# Patient Record
Sex: Female | Born: 1965 | Race: White | Hispanic: No | Marital: Married | Smoking: Current every day smoker
Health system: Southern US, Community
[De-identification: ages and names within clinical notes are randomized; demographics above are authoritative.]

## PROBLEM LIST (undated history)

## (undated) DIAGNOSIS — I10 Essential (primary) hypertension: Secondary | ICD-10-CM

## (undated) DIAGNOSIS — I4891 Unspecified atrial fibrillation: Secondary | ICD-10-CM

## (undated) DIAGNOSIS — F419 Anxiety disorder, unspecified: Secondary | ICD-10-CM

## (undated) HISTORY — PX: TUBAL LIGATION: SHX77

---

## 2005-12-09 ENCOUNTER — Emergency Department: Payer: Self-pay | Admitting: Emergency Medicine

## 2006-04-11 ENCOUNTER — Other Ambulatory Visit: Payer: Self-pay

## 2006-04-11 ENCOUNTER — Emergency Department: Payer: Self-pay | Admitting: Unknown Physician Specialty

## 2007-04-03 ENCOUNTER — Emergency Department: Payer: Self-pay | Admitting: Emergency Medicine

## 2012-05-10 DIAGNOSIS — Z720 Tobacco use: Secondary | ICD-10-CM | POA: Insufficient documentation

## 2012-05-11 DIAGNOSIS — J189 Pneumonia, unspecified organism: Secondary | ICD-10-CM

## 2012-05-11 HISTORY — DX: Pneumonia, unspecified organism: J18.9

## 2012-05-13 DIAGNOSIS — R918 Other nonspecific abnormal finding of lung field: Secondary | ICD-10-CM | POA: Insufficient documentation

## 2012-05-13 DIAGNOSIS — R0602 Shortness of breath: Secondary | ICD-10-CM | POA: Insufficient documentation

## 2012-05-13 HISTORY — DX: Shortness of breath: R06.02

## 2014-08-22 ENCOUNTER — Inpatient Hospital Stay
Admit: 2014-08-22 | Discharge: 2014-08-22 | Disposition: A | Discharge status: Home / Self Care | Payer: Self-pay | Attending: Internal Medicine | Admitting: Internal Medicine

## 2014-08-22 ENCOUNTER — Emergency Department: Payer: Self-pay

## 2014-08-22 ENCOUNTER — Inpatient Hospital Stay
Admission: EM | Admit: 2014-08-22 | Discharge: 2014-08-24 | DRG: 310 | Disposition: A | Discharge status: Home / Self Care | Payer: Self-pay | Attending: Internal Medicine | Admitting: Internal Medicine

## 2014-08-22 DIAGNOSIS — Z8249 Family history of ischemic heart disease and other diseases of the circulatory system: Secondary | ICD-10-CM

## 2014-08-22 DIAGNOSIS — F17210 Nicotine dependence, cigarettes, uncomplicated: Secondary | ICD-10-CM | POA: Diagnosis present

## 2014-08-22 DIAGNOSIS — Z7982 Long term (current) use of aspirin: Secondary | ICD-10-CM

## 2014-08-22 DIAGNOSIS — Z9851 Tubal ligation status: Secondary | ICD-10-CM

## 2014-08-22 DIAGNOSIS — Z833 Family history of diabetes mellitus: Secondary | ICD-10-CM

## 2014-08-22 DIAGNOSIS — E876 Hypokalemia: Secondary | ICD-10-CM | POA: Diagnosis present

## 2014-08-22 DIAGNOSIS — F1020 Alcohol dependence, uncomplicated: Secondary | ICD-10-CM | POA: Diagnosis present

## 2014-08-22 DIAGNOSIS — I4891 Unspecified atrial fibrillation: Principal | ICD-10-CM

## 2014-08-22 DIAGNOSIS — Z79899 Other long term (current) drug therapy: Secondary | ICD-10-CM

## 2014-08-22 DIAGNOSIS — F419 Anxiety disorder, unspecified: Secondary | ICD-10-CM | POA: Diagnosis present

## 2014-08-22 HISTORY — DX: Unspecified atrial fibrillation: I48.91

## 2014-08-22 HISTORY — DX: Anxiety disorder, unspecified: F41.9

## 2014-08-22 LAB — BASIC METABOLIC PANEL
Anion gap: 13 (ref 5–15)
BUN: 5 mg/dL — ABNORMAL LOW (ref 6–20)
CO2: 21 mmol/L — ABNORMAL LOW (ref 22–32)
Calcium: 9 mg/dL (ref 8.9–10.3)
Chloride: 106 mmol/L (ref 101–111)
Creatinine, Ser: 0.49 mg/dL (ref 0.44–1.00)
GFR calc Af Amer: >60 mL/min (ref >60)
GFR calc non Af Amer: >60 mL/min (ref >60)
Glucose, Bld: 107 mg/dL — ABNORMAL HIGH (ref 65–99)
Potassium: 3.5 mmol/L (ref 3.5–5.1)
Sodium: 140 mmol/L (ref 135–145)

## 2014-08-22 LAB — CBC WITH DIFFERENTIAL/PLATELET
Basophils Absolute: 0 10*3/uL (ref 0–0.1)
Basophils Relative: 1 %
Eosinophils Absolute: 0 10*3/uL (ref 0–0.7)
Eosinophils Relative: 1 %
HCT: 45.7 % (ref 35.0–47.0)
Hemoglobin: 15.8 g/dL (ref 12.0–16.0)
Lymphocytes Relative: 20 %
Lymphs Abs: 1.4 10*3/uL (ref 1.0–3.6)
MCH: 34.9 pg — ABNORMAL HIGH (ref 26.0–34.0)
MCHC: 34.6 g/dL (ref 32.0–36.0)
MCV: 100.8 fL — ABNORMAL HIGH (ref 80.0–100.0)
Monocytes Absolute: 0.6 10*3/uL (ref 0.2–0.9)
Monocytes Relative: 9 %
Neutro Abs: 5 10*3/uL (ref 1.4–6.5)
Neutrophils Relative %: 69 %
Platelets: 216 10*3/uL (ref 150–440)
RBC: 4.53 MIL/uL (ref 3.80–5.20)
RDW: 12.5 % (ref 11.5–14.5)
WBC: 7.2 10*3/uL (ref 3.6–11.0)

## 2014-08-22 LAB — URINE DRUG SCREEN, QUALITATIVE (ARMC ONLY)
Amphetamines, Ur Screen: NOT DETECTED
Barbiturates, Ur Screen: NOT DETECTED
Benzodiazepine, Ur Scrn: POSITIVE — AB
Cannabinoid 50 Ng, Ur ~~LOC~~: NOT DETECTED
Cocaine Metabolite,Ur ~~LOC~~: NOT DETECTED
MDMA (Ecstasy)Ur Screen: NOT DETECTED
Methadone Scn, Ur: NOT DETECTED
Opiate, Ur Screen: NOT DETECTED
Phencyclidine (PCP) Ur S: NOT DETECTED
Tricyclic, Ur Screen: NOT DETECTED

## 2014-08-22 LAB — MRSA PCR SCREENING: MRSA by PCR: NEGATIVE

## 2014-08-22 LAB — TSH: TSH: 1.76 u[IU]/mL (ref 0.350–4.500)

## 2014-08-22 LAB — TROPONIN I
Troponin I: <0.03 ng/mL (ref <0.031)
Troponin I: <0.03 ng/mL (ref <0.031)
Troponin I: <0.03 ng/mL (ref <0.031)

## 2014-08-22 LAB — PHOSPHORUS: Phosphorus: 3.4 mg/dL (ref 2.5–4.6)

## 2014-08-22 LAB — MAGNESIUM: Magnesium: 1.5 mg/dL — ABNORMAL LOW (ref 1.7–2.4)

## 2014-08-22 MED ORDER — ENOXAPARIN SODIUM 60 MG/0.6ML ~~LOC~~ SOLN
50.0000 mg | Freq: Two times a day (BID) | SUBCUTANEOUS | Status: DC
  Administered 2014-08-22 – 2014-08-24 (×4): 50 mg via SUBCUTANEOUS
  Filled 2014-08-22 (×4): qty 0.6

## 2014-08-22 MED ORDER — AMIODARONE LOAD VIA INFUSION
150.0000 mg | Freq: Once | INTRAVENOUS | Status: AC
  Administered 2014-08-22: 150 mg via INTRAVENOUS
  Filled 2014-08-22: qty 83.34

## 2014-08-22 MED ORDER — DILTIAZEM HCL 25 MG/5ML IV SOLN
20.0000 mg | Freq: Once | INTRAVENOUS | Status: AC
  Administered 2014-08-22: 20 mg via INTRAVENOUS

## 2014-08-22 MED ORDER — DEXTROSE 5 % IV SOLN
5.0000 mg/h | Freq: Once | INTRAVENOUS | Status: AC
  Administered 2014-08-22: 5 mg/h via INTRAVENOUS
  Filled 2014-08-22: qty 100

## 2014-08-22 MED ORDER — SODIUM CHLORIDE 0.9 % IV BOLUS (SEPSIS)
1000.0000 mL | Freq: Once | INTRAVENOUS | Status: AC
  Administered 2014-08-22: 1000 mL via INTRAVENOUS

## 2014-08-22 MED ORDER — AMIODARONE HCL IN DEXTROSE 360-4.14 MG/200ML-% IV SOLN
30.0000 mg/h | INTRAVENOUS | Status: DC
  Administered 2014-08-23 – 2014-08-24 (×4): 30 mg/h via INTRAVENOUS
  Filled 2014-08-22 (×8): qty 200

## 2014-08-22 MED ORDER — AMIODARONE HCL IN DEXTROSE 360-4.14 MG/200ML-% IV SOLN
60.0000 mg/h | INTRAVENOUS | Status: DC
  Administered 2014-08-22 (×2): 60 mg/h via INTRAVENOUS
  Filled 2014-08-22 (×2): qty 200

## 2014-08-22 MED ORDER — DEXTROSE 5 % IV SOLN
5.0000 mg/h | INTRAVENOUS | Status: DC
  Administered 2014-08-22: 7.5 mg/h via INTRAVENOUS

## 2014-08-22 MED ORDER — DIAZEPAM 5 MG PO TABS
5.0000 mg | ORAL_TABLET | Freq: Three times a day (TID) | ORAL | Status: DC | PRN
  Administered 2014-08-22 – 2014-08-24 (×4): 5 mg via ORAL
  Filled 2014-08-22 (×4): qty 1

## 2014-08-22 MED ORDER — NICOTINE 21 MG/24HR TD PT24
21.0000 mg | MEDICATED_PATCH | Freq: Every day | TRANSDERMAL | Status: DC
  Administered 2014-08-22 – 2014-08-24 (×3): 21 mg via TRANSDERMAL
  Filled 2014-08-22 (×3): qty 1

## 2014-08-22 NOTE — ED Provider Notes (Signed)
[facility] Emergency Department Provider Note   ____________________________________________  Time seen: On EMS arrival I have reviewed the triage vital signs and the triage nursing note.  HISTORY  Chief Complaint Shortness of Breath and Palpitations   Historian Patient  HPI Ariel Hensley is a 49 y.o. female was no history of cardiac or pulmonary disease who was driving this afternoon when she started feel short of breath and feeling her heart was racing and fluttering. She was driving at a time and drove home and tried to lie down when it seemed like it got even worse. When EMS arrived her heart rate was near 200 and appeared to be irregular. They did give adenosine 3 at 6, 12, and 12 mg with some slowing but showed a irregular heartbeat which returned to tachycardia between 150 and 180. Patient reporting some symptomatic improvement wearing a nonrebreather mask although she is not hypoxic. No chest pain. No leg swelling. On further discussion she does think that she has been a little bit short of breath for the past 2 weeks, however she thought was due to the humidity outside.    Past Medical History  Diagnosis Date  . Anxiety     There are no active problems to display for this patient.   Past Surgical History  Procedure Laterality Date  . Tubal ligation      Current Outpatient Rx  Name  Route  Sig  Dispense  Refill  . Aspirin-Salicylamide-Caffeine (BC HEADACHE PO)   Oral   Take 1 packet by mouth daily as needed (for headaches).         . diazepam (VALIUM) 10 MG tablet   Oral   Take 5 mg by mouth every 6 (six) hours as needed for anxiety.           Allergies Review of patient's allergies indicates no known allergies.  History reviewed. No pertinent family history.  Social History History  Substance Use Topics  . Smoking status: Current Every Day Smoker  . Smokeless tobacco: Never Used  . Alcohol Use: 1.8 - 3.0 oz/week    3-5  Cans of beer per week   positive smoker  Review of Systems  Constitutional: Negative for fever. Eyes: Negative for visual changes. ENT: Negative for sore throat. Cardiovascular: Negative for chest pain. Positive for palpitations Respiratory: Negative for cough Gastrointestinal: Negative for abdominal pain, vomiting and diarrhea. Genitourinary: Negative for dysuria. Musculoskeletal: Negative for back pain. Skin: Negative for rash. Neurological: Negative for headaches, focal weakness or numbness. 10 point Review of Systems otherwise negative ____________________________________________   PHYSICAL EXAM:  VITAL SIGNS: ED Triage Vitals  Enc Vitals Group     BP 08/22/14 1521 124/93 mmHg     Pulse Rate 08/22/14 1521 89     Resp 08/22/14 1521 17     Temp --      Temp src --      SpO2 08/22/14 1521 99 %     Weight --      Height --      Head Cir --      Peak Flow --      Pain Score --      Pain Loc --      Pain Edu? --      Excl. in GC? --      Constitutional: Alert and oriented. Somewhat anxious.  Eyes: Conjunctivae are normal. PERRL. Normal extraocular movements. ENT   Head: Normocephalic and atraumatic.   Nose: No congestion/rhinnorhea.  Mouth/Throat: Mucous membranes are moist.   Neck: No stridor. Cardiovascular/Chest: Irregularly irregular and tachycardic.  No murmurs, rubs, or gallops. Respiratory: Normal respiratory effort without tachypnea nor retractions. Breath sounds are clear and equal bilaterally. No wheezes/rales/rhonchi. Gastrointestinal: Soft. No distention, no guarding, no rebound. Nontender   Genitourinary/rectal:Deferred Musculoskeletal: Nontender with normal range of motion in all extremities. No joint effusions.  No lower extremity tenderness nor edema. Neurologic:  Normal speech and language. No gross or focal neurologic deficits are appreciated. Skin:  Skin is warm, dry and intact. No rash noted. Psychiatric: Mood and affect are  normal. Speech and behavior are normal. Patient exhibits appropriate insight and judgment.  ____________________________________________   EKG I, Governor Rooks, MD, the attending physician have personally viewed and interpreted all ECGs.  Irregularly irregular. 104 bpm. Atrial fibrillation with rapid ventricular response. Normal axis. Minimal ST segment depression inferiorly. ____________________________________________  LABS (pertinent positives/negatives)  Basic metabolic panel without significant abnormality Troponin less than 0.03 White blood count 7.2, hemoglobin 15.8  ____________________________________________  RADIOLOGY All Xrays were viewed by me. Imaging interpreted by Radiologist.  Chest portable: Negative __________________________________________  PROCEDURES  Procedure(s) performed: None Critical Care performed: CRITICAL CARE Performed by: Governor Rooks   Total critical care time: 30 minutes  Critical care time was exclusive of separately billable procedures and treating other patients.  Critical care was necessary to treat or prevent imminent or life-threatening deterioration.  Critical care was time spent personally by me on the following activities: development of treatment plan with patient and/or surrogate as well as nursing, discussions with consultants, evaluation of patient's response to treatment, examination of patient, obtaining history from patient or surrogate, ordering and performing treatments and interventions, ordering and review of laboratory studies, ordering and review of radiographic studies, pulse oximetry and re-evaluation of patient's condition.   ____________________________________________   ED COURSE / ASSESSMENT AND PLAN  CONSULTATIONS: Hospitalist for admission  Pertinent labs & imaging results that were available during my care of the patient were reviewed by me and considered in my medical decision making (see chart for  details).  Patient arrived by EMS heart rate still in the 150s to 180s and patient feeling very anxious and short of breath. No hypoxia. No chest pain. EKG shows new onset atrial fibrillation with rapid ventricular response. Patient was given a bolus of 20 mg Cardizem and resultant heart rate down to 110 to 130. Diltiazem drip was started.  Laboratory evaluation is reassuring. I discussed the case with the hospitalist for admission.   Patient / Family / Caregiver informed of clinical course, medical decision-making process, and agree with plan.   I discussed return precautions, follow-up instructions, and discharged instructions with patient and/or family.  ___________________________________________   FINAL CLINICAL IMPRESSION(S) / ED DIAGNOSES   Final diagnoses:  Atrial fibrillation with rapid ventricular response  New onset atrial fibrillation     Governor Rooks, MD 08/22/14 [phone]

## 2014-08-22 NOTE — ED Notes (Signed)
Diltiazem (CARDIZEM) titrated to 7.5mg /hr from /hr.

## 2014-08-22 NOTE — ED Notes (Signed)
According to EMS, pt began to feel like her heart was racing and fluttering while driving. After an hr of experiencing this she began to become SOB. Pt is A+OX4, stating she feels weak, denies pain. Pt currently in SVT HR 157, 59 P, 96% RA, 124/93 BP

## 2014-08-22 NOTE — Progress Notes (Signed)
ANTICOAGULATION CONSULT NOTE - Initial Consult  Pharmacy Consult for Lovenox Indication: atrial fibrillation  No Known Allergies  Patient Measurements: Weight: 113 lb 5.1 oz (51.4 kg) Heparin Dosing Weight:   Vital Signs: BP: 114/81 mmHg (08/05 1723) Pulse Rate: 117 (08/05 1723)  Labs:  Recent Labs  08/22/14 1534 08/22/14 1718  HGB 15.8  --   HCT 45.7  --   PLT 216  --   CREATININE 0.49  --   TROPONINI <0.03 <0.03    CrCl cannot be calculated (Unknown ideal weight.).   Medical History: Past Medical History  Diagnosis Date  . Anxiety     Medications:  Prescriptions prior to admission  Medication Sig Dispense Refill Last Dose  . Aspirin-Salicylamide-Caffeine (BC HEADACHE PO) Take 1 packet by mouth daily as needed (for headaches).   PRN at PRN  . diazepam (VALIUM) 10 MG tablet Take 5 mg by mouth every 6 (six) hours as needed for anxiety.   08/22/2014 at Unknown time    Assessment: SrCr = 0.49  TBW = 51.4  MD wants /kg BID dosing for Afib.  Goal of Therapy:  VTE prophylaxis Monitor platelets by anticoagulation protocol: Yes   Plan:  Lovenox 50 mg SQ Q12H ordered.  Will monitor CBC daily.   Georg Ang D 08/22/2014,7:25 PM

## 2014-08-22 NOTE — H&P (Addendum)
[facility] - [place] [facility]   PATIENT NAME: Ariel Hensley    MR#:  [id number]  DATE OF BIRTH:  June 19, 1965  DATE OF ADMISSION:  08/22/2014  PRIMARY CARE PHYSICIAN: [facility]  REQUESTING/REFERRING PHYSICIAN: Governor Rooks  CHIEF COMPLAINT:   Chief Complaint  Patient presents with  . Shortness of Breath  . Palpitations    HISTORY OF PRESENT ILLNESS: Ariel Hensley  is a 49 y.o. female with a known history of anxiety but no other medical issues and she follows regularly with [facility] for a physical checkup not on any prescription medications. Today suddenly started having palpitation episodes which was not stopping she tried to relax for a while but did not help it. This episode happened while she was driving just a short distance and no any exertional activities, she denies drinking excessive coffee or caffeinated drinks or energy drinks, excessive smoking, excessive exercise in the last 24 hours. She had similar episodes in the past but it just lasted for like a few seconds and she is fine after that. But today's episode was not stopping she tried to relax and drink some water continued to have the palpitation with chest tightness and feeling of shortness of breath so she decided to call ambulance. EMS gave her the adenosine injections 3. Her heart rate was up to 200 with EMS. In ER her heart rate was still 150s and so she received injection Cardizem twice with short-term response to that but again going back up to 120 and 1:30 heart rate and she continued to be in atrial fibrillation. Her blood pressure is stable and she does not have any chest pain. She was started on Cardizem drip by ER physician and given for admission to hospitalist team.  PAST MEDICAL HISTORY:   Past Medical History  Diagnosis Date  . Anxiety     PAST SURGICAL HISTORY:  Past Surgical History  Procedure Laterality Date  . Tubal ligation      SOCIAL HISTORY:  History   Substance Use Topics  . Smoking status: Current Every Day Smoker -- 1.50 packs/day  . Smokeless tobacco: Never Used  . Alcohol Use: 3.0 - 4.2 oz/week    5-7 Cans of beer per week    FAMILY HISTORY:  Family History  Problem Relation Age of Onset  . Diabetes Mother   . Diabetes Maternal Grandmother   . Heart attack Paternal Grandfather     DRUG ALLERGIES: No Known Allergies  REVIEW OF SYSTEMS:   CONSTITUTIONAL: No fever, fatigue or weakness.  EYES: No blurred or double vision.  EARS, NOSE, AND THROAT: No tinnitus or ear pain.  RESPIRATORY: No cough, had spme SOB with episode.No wheezing or hemoptysis.  CARDIOVASCULAR: No chest pain, orthopnea, edema. Had severe palpitations. GASTROINTESTINAL: No nausea, vomiting, diarrhea or abdominal pain.  GENITOURINARY: No dysuria, hematuria.  ENDOCRINE: No polyuria, nocturia,  HEMATOLOGY: No anemia, easy bruising or bleeding SKIN: No rash or lesion. MUSCULOSKELETAL: No joint pain or arthritis.   NEUROLOGIC: No tingling, numbness, weakness.  PSYCHIATRY: No anxiety or depression.   MEDICATIONS AT HOME:  Prior to Admission medications   Medication Sig Start Date End Date Taking? Authorizing Provider  Aspirin-Salicylamide-Caffeine (BC HEADACHE PO) Take 1 packet by mouth daily as needed (for headaches).   Yes Historical Provider, MD  diazepam (VALIUM) 10 MG tablet Take 5 mg by mouth every 6 (six) hours as needed for anxiety.   Yes Historical Provider, MD      PHYSICAL EXAMINATION:  VITAL SIGNS: Blood pressure 138/113, pulse 96, resp. rate 24, SpO2 100 %.  GENERAL:  49 y.o.-year-old thin patient lying in the bed with no acute distress.  EYES: Pupils equal, round, reactive to light and accommodation. No scleral icterus. Extraocular muscles intact.  HEENT: Head atraumatic, normocephalic. Oropharynx and nasopharynx clear.  NECK:  Supple, no jugular venous distention. No thyroid enlargement, no tenderness.  LUNGS: Normal breath sounds  bilaterally, no wheezing, rales,rhonchi or crepitation. No use of accessory muscles of respiration.  CARDIOVASCULAR: S1, S2 normal, fast and irregular. No murmurs.  ABDOMEN: Soft, nontender, nondistended. Bowel sounds present. No organomegaly or mass.  EXTREMITIES: No pedal edema, cyanosis, or clubbing.  NEUROLOGIC: Cranial nerves II through XII are intact. Muscle strength 5/5 in all extremities. Sensation intact. Gait not checked.  PSYCHIATRIC: The patient is alert and oriented x 3.  SKIN: No obvious rash, lesion, or ulcer.   LABORATORY PANEL:   CBC  Recent Labs Lab 08/22/14 1534  WBC 7.2  HGB 15.8  HCT 45.7  PLT 216  MCV 100.8*  MCH 34.9*  MCHC 34.6  RDW 12.5  LYMPHSABS 1.4  MONOABS 0.6  EOSABS 0.0  BASOSABS 0.0   ------------------------------------------------------------------------------------------------------------------  Chemistries   Recent Labs Lab 08/22/14 1534  NA 140  K 3.5  CL 106  CO2 21*  GLUCOSE 107*  BUN 5*  CREATININE 0.49  CALCIUM 9.0   ------------------------------------------------------------------------------------------------------------------ CrCl cannot be calculated (Unknown ideal weight.). ------------------------------------------------------------------------------------------------------------------ No results for input(s): TSH, T4TOTAL, T3FREE, THYROIDAB in the last 72 hours.  Invalid input(s): FREET3   Coagulation profile No results for input(s): INR, PROTIME in the last 168 hours. ------------------------------------------------------------------------------------------------------------------- No results for input(s): DDIMER in the last 72 hours. -------------------------------------------------------------------------------------------------------------------  Cardiac Enzymes  Recent Labs Lab 08/22/14 1534  TROPONINI <0.03    ------------------------------------------------------------------------------------------------------------------ Invalid input(s): POCBNP  ---------------------------------------------------------------------------------------------------------------  Urinalysis No results found for: COLORURINE, APPEARANCEUR, LABSPEC, PHURINE, GLUCOSEU, HGBUR, BILIRUBINUR, KETONESUR, PROTEINUR, UROBILINOGEN, NITRITE, LEUKOCYTESUR   RADIOLOGY: Dg Chest Port 1 View  08/22/2014   CLINICAL DATA:  New onset atrial fibrillation with shortness of breath  EXAM: PORTABLE CHEST - 1 VIEW  COMPARISON:  April 11, 2006  FINDINGS: There is no edema or consolidation. The heart size and pulmonary vascularity normal. No adenopathy. No bone lesions. There is mild apical pleural thickening on the right, stable.  IMPRESSION: No edema or consolidation.  No change in cardiac silhouette.   Electronically Signed   By: Bretta Bang III M.D.   On: 08/22/2014 15:53    EKG: A fib with ventricular rate up to 120.  IMPRESSION AND PLAN:  * A. fib with RVR I will check TSH level, monitor on telemetry and follow serial troponin. Check echocardiogram. She is on Cardizem IV drip will monitor on stepdown unit, call cardiology consult.  Give lovenox therapeutic dose for now.  * Anxiety continue home medication.  * Smoking Counseled to quit smoking for 4 minutes she would like to nicotine patch here.  * Chronic alcoholism  She drinks 5-7 beers every evening, last drink was last night.  But she says she never going withdrawal if she don't drink for a day or 2.  Currently there are no signs of withdrawal or anxiety but will continue watching for that.  All the records are reviewed and case discussed with ED provider. Management plans discussed with the patient, family and they are in agreement.  CODE STATUS: full   TOTAL TIME TAKING CARE OF THIS PATIENT: 50 minutes critical care.  Plan spent  with patient and her family  who is present in the room about the requirement of continuous heart monitoring due to irregular and fast heartbeat she will be monitored in CCU and they understand and agree with the plan.  Altamese Dilling M.D on 08/22/2014   Between 7am to 6pm - Pager - [phone]  After 6pm go to [email or web address] - password [email or web address] [facility]  [place]  [facility]ospitalists  Office  [phone]  CC: Primary care physician; No primary care provider on file.

## 2014-08-22 NOTE — Progress Notes (Signed)
Ariel Hensley is a 49 y.o. female  [id number]  Primary Cardiologist: Adrian Blackwater Reason for Consultation: Atrial fibrillation  HPI: This is a 49 year old white female with a past medical history of anxiety disorder presented to the emergency room with shortness of breath palpitation and tightness in the chest. Patient states all of a sudden she felt like she is going to pass out and she was having fluttering in the chest. In the emergency room her heart rate was around 200 with rapid ventricular response rate and A. fib. Patient was started on IV Cardizem and her chest pain got better. Patient still has intermittent heaviness in the chest.   Review of Systems: Review of Systems  Respiratory: Positive for shortness of breath.   Cardiovascular: Positive for chest pain and palpitations.  All other systems reviewed and are negative.     Past Medical History  Diagnosis Date  . Anxiety     Medications Prior to Admission  Medication Sig Dispense Refill  . Aspirin-Salicylamide-Caffeine (BC HEADACHE PO) Take 1 packet by mouth daily as needed (for headaches).    . diazepam (VALIUM) 10 MG tablet Take 5 mg by mouth every 6 (six) hours as needed for anxiety.       Marland Kitchen amiodarone  150 mg Intravenous Once  . nicotine  21 mg Transdermal Daily    Infusions: . amiodarone     Followed by  . [START ON 08/23/2014] amiodarone      No Known Allergies  History   Social History  . Marital Status: Married    Spouse Name: N/A  . Number of Children: N/A  . Years of Education: N/A   Occupational History  . Not on file.   Social History Main Topics  . Smoking status: Current Every Day Smoker -- 1.50 packs/day  . Smokeless tobacco: Never Used  . Alcohol Use: 3.0 - 4.2 oz/week    5-7 Cans of beer per week  . Drug Use: No  . Sexual Activity: Not on file   Other Topics Concern  . Not on file   Social History Narrative  . No narrative on file    Family History  Problem Relation  Age of Onset  . Diabetes Mother   . Diabetes Maternal Grandmother   . Heart attack Paternal Grandfather     PHYSICAL EXAM: Filed Vitals:   08/22/14 1723  BP: 114/81  Pulse: 117  Resp: 19    No intake or output data in the 24 hours ending 08/22/14 1816  General:  Well appearing. No respiratory difficulty HEENT: normal Neck: supple. no JVD. Carotids 2+ bilat; no bruits. No lymphadenopathy or thryomegaly appreciated. Cor: PMI nondisplaced. Regular rate & rhythm. No rubs, gallops or murmurs. Lungs: clear Abdomen: soft, nontender, nondistended. No hepatosplenomegaly. No bruits or masses. Good bowel sounds. Extremities: no cyanosis, clubbing, rash, edema Neuro: alert & oriented x 3, cranial nerves grossly intact. moves all 4 extremities w/o difficulty. Affect pleasant.  ECG: EKG reveals A. fib with rapid ventricular response rate. Ventricular rate is about 112 with nonspecific ST-T changes.  Results for orders placed or performed during the hospital encounter of 08/22/14 (from the past 24 hour(s))  Basic metabolic panel     Status: Abnormal   Collection Time: 08/22/14  3:34 PM  Result Value Ref Range   Sodium 140 135 - 145 mmol/L   Potassium 3.5 3.5 - 5.1 mmol/L   Chloride 106 101 - 111 mmol/L   CO2 21 (L) 22 -  32 mmol/L   Glucose, Bld 107 (H) 65 - 99 mg/dL   BUN 5 (L) 6 - 20 mg/dL   Creatinine, Ser 4.09 0.44 - 1.00 mg/dL   Calcium 9.0 8.9 - 81.1 mg/dL   GFR calc non Af Amer >60 >60 mL/min   GFR calc Af Amer >60 >60 mL/min   Anion gap 13 5 - 15  Troponin I     Status: None   Collection Time: 08/22/14  3:34 PM  Result Value Ref Range   Troponin I <0.03 <0.031 ng/mL  CBC with Differential     Status: Abnormal   Collection Time: 08/22/14  3:34 PM  Result Value Ref Range   WBC 7.2 3.6 - 11.0 K/uL   RBC 4.53 3.80 - 5.20 MIL/uL   Hemoglobin 15.8 12.0 - 16.0 g/dL   HCT 91.4 78.2 - 95.6 %   MCV 100.8 (H) 80.0 - 100.0 fL   MCH 34.9 (H) 26.0 - 34.0 pg   MCHC 34.6 32.0 - 36.0  g/dL   RDW 21.3 08.6 - 57.8 %   Platelets 216 150 - 440 K/uL   Neutrophils Relative % 69 %   Neutro Abs 5.0 1.4 - 6.5 K/uL   Lymphocytes Relative 20 %   Lymphs Abs 1.4 1.0 - 3.6 K/uL   Monocytes Relative 9 %   Monocytes Absolute 0.6 0.2 - 0.9 K/uL   Eosinophils Relative 1 %   Eosinophils Absolute 0.0 0 - 0.7 K/uL   Basophils Relative 1 %   Basophils Absolute 0.0 0 - 0.1 K/uL  Troponin I     Status: None   Collection Time: 08/22/14  5:18 PM  Result Value Ref Range   Troponin I <0.03 <0.031 ng/mL   Dg Chest Port 1 View  08/22/2014   CLINICAL DATA:  New onset atrial fibrillation with shortness of breath  EXAM: PORTABLE CHEST - 1 VIEW  COMPARISON:  April 11, 2006  FINDINGS: There is no edema or consolidation. The heart size and pulmonary vascularity normal. No adenopathy. No bone lesions. There is mild apical pleural thickening on the right, stable.  IMPRESSION: No edema or consolidation.  No change in cardiac silhouette.   Electronically Signed   By: Bretta Bang III M.D.   On: 08/22/2014 15:53     ASSESSMENT AND PLAN: Atrial fibrillation with rapid ventricular response rate. Patient is currently on Cardizem but is still in A. fib with rapid rate, advise changing to IV amiodarone. We will discontinue Cardizem. Advise and heparin or Lovenox and echocardiogram. If he continues to if she continues to have chest pain will need to do cardiac catheterization.  KHAN,SHAUKAT A

## 2014-08-22 NOTE — ED Notes (Signed)
Pt states her SOB has resolved.

## 2014-08-23 LAB — CBC
HCT: 40 % (ref 35.0–47.0)
Hemoglobin: 14.2 g/dL (ref 12.0–16.0)
MCH: 36.3 pg — ABNORMAL HIGH (ref 26.0–34.0)
MCHC: 35.4 g/dL (ref 32.0–36.0)
MCV: 102.4 fL — ABNORMAL HIGH (ref 80.0–100.0)
Platelets: 175 10*3/uL (ref 150–440)
RBC: 3.9 MIL/uL (ref 3.80–5.20)
RDW: 12.8 % (ref 11.5–14.5)
WBC: 6.1 10*3/uL (ref 3.6–11.0)

## 2014-08-23 LAB — BASIC METABOLIC PANEL
Anion gap: 8 (ref 5–15)
BUN: 5 mg/dL — ABNORMAL LOW (ref 6–20)
CO2: 27 mmol/L (ref 22–32)
Calcium: 8.8 mg/dL — ABNORMAL LOW (ref 8.9–10.3)
Chloride: 105 mmol/L (ref 101–111)
Creatinine, Ser: 0.49 mg/dL (ref 0.44–1.00)
GFR calc Af Amer: >60 mL/min (ref >60)
GFR calc non Af Amer: >60 mL/min (ref >60)
Glucose, Bld: 126 mg/dL — ABNORMAL HIGH (ref 65–99)
Potassium: 3.2 mmol/L — ABNORMAL LOW (ref 3.5–5.1)
Sodium: 140 mmol/L (ref 135–145)

## 2014-08-23 LAB — TROPONIN I: Troponin I: <0.03 ng/mL (ref <0.031)

## 2014-08-23 MED ORDER — SODIUM CHLORIDE 0.9 % IJ SOLN
3.0000 mL | Freq: Two times a day (BID) | INTRAMUSCULAR | Status: DC
  Administered 2014-08-23: 3 mL via INTRAVENOUS

## 2014-08-23 MED ORDER — POTASSIUM CHLORIDE CRYS ER 20 MEQ PO TBCR
20.0000 meq | EXTENDED_RELEASE_TABLET | Freq: Two times a day (BID) | ORAL | Status: DC
  Administered 2014-08-23 – 2014-08-24 (×3): 20 meq via ORAL
  Filled 2014-08-23 (×3): qty 1

## 2014-08-23 MED ORDER — SODIUM CHLORIDE 0.9 % IJ SOLN: 3.0000 mL | INTRAMUSCULAR | Status: DC | PRN

## 2014-08-23 MED ORDER — ZOLPIDEM TARTRATE 5 MG PO TABS
5.0000 mg | ORAL_TABLET | Freq: Every evening | ORAL | Status: DC | PRN
  Administered 2014-08-24: 5 mg via ORAL
  Filled 2014-08-23 (×2): qty 1

## 2014-08-23 MED ORDER — DIAZEPAM 5 MG PO TABS
5.0000 mg | ORAL_TABLET | Freq: Once | ORAL | Status: AC
  Administered 2014-08-23: 5 mg via ORAL
  Filled 2014-08-23: qty 1

## 2014-08-23 MED ORDER — SODIUM CHLORIDE 0.9 % IV SOLN: 250.0000 mL | INTRAVENOUS | Status: DC

## 2014-08-23 MED ORDER — IBUPROFEN 400 MG PO TABS
400.0000 mg | ORAL_TABLET | Freq: Once | ORAL | Status: AC
  Administered 2014-08-23: 400 mg via ORAL
  Filled 2014-08-23: qty 1

## 2014-08-23 MED ORDER — MAGNESIUM SULFATE 2 GM/50ML IV SOLN
2.0000 g | Freq: Once | INTRAVENOUS | Status: AC
  Administered 2014-08-23: 2 g via INTRAVENOUS
  Filled 2014-08-23: qty 50

## 2014-08-23 MED ORDER — ACETAMINOPHEN 325 MG PO TABS
650.0000 mg | ORAL_TABLET | Freq: Four times a day (QID) | ORAL | Status: DC | PRN
  Administered 2014-08-23 – 2014-08-24 (×3): 650 mg via ORAL
  Filled 2014-08-23 (×3): qty 2

## 2014-08-23 NOTE — Progress Notes (Signed)
[facility] Physicians - [place] at [facility]   PATIENT NAME: Ariel Hensley    MR#:  [id number]  DATE OF BIRTH:  [birth year]  SUBJECTIVE:  CHIEF COMPLAINT:   Chief Complaint  Patient presents with  . Shortness of Breath  . Palpitations   found having A fib with RVR- started on cardizem drip initially, still was not controlled so later cardiologist started on amiodarone drip.  REVIEW OF SYSTEMS:  CONSTITUTIONAL: No fever, fatigue or weakness.  EYES: No blurred or double vision.  EARS, NOSE, AND THROAT: No tinnitus or ear pain.  RESPIRATORY: No cough, shortness of breath, wheezing or hemoptysis.  CARDIOVASCULAR: No chest pain, orthopnea, edema. Had palpitation. GASTROINTESTINAL: No nausea, vomiting, diarrhea or abdominal pain.  GENITOURINARY: No dysuria, hematuria.  ENDOCRINE: No polyuria, nocturia,  HEMATOLOGY: No anemia, easy bruising or bleeding SKIN: No rash or lesion. MUSCULOSKELETAL: No joint pain or arthritis.   NEUROLOGIC: No tingling, numbness, weakness.  PSYCHIATRY: No anxiety or depression.   ROS  DRUG ALLERGIES:  No Known Allergies  VITALS:  Blood pressure 97/74, pulse 84, temperature 97.7 F (36.5 C), temperature source Oral, resp. rate 21, height  (1.727 m), weight 51.4 kg (113 lb 5.1 oz), SpO2 97 %.  PHYSICAL EXAMINATION:  GENERAL: 49 y.o.-year-old thin patient lying in the bed with no acute distress.  EYES: Pupils equal, round, reactive to light and accommodation. No scleral icterus. Extraocular muscles intact.  HEENT: Head atraumatic, normocephalic. Oropharynx and nasopharynx clear.  NECK: Supple, no jugular venous distention. No thyroid enlargement, no tenderness.  LUNGS: Normal breath sounds bilaterally, no wheezing, rales,rhonchi or crepitation. No use of accessory muscles of respiration.  CARDIOVASCULAR: S1, S2 normal, fast and irregular. No murmurs.  ABDOMEN: Soft, nontender, nondistended. Bowel sounds present. No  organomegaly or mass.  EXTREMITIES: No pedal edema, cyanosis, or clubbing.  NEUROLOGIC: Cranial nerves II through XII are intact. Muscle strength 5/5 in all extremities. Sensation intact. Gait not checked.  PSYCHIATRIC: The patient is alert and oriented x 3.  SKIN: No obvious rash, lesion, or ulcer.   Physical Exam LABORATORY PANEL:   CBC  Recent Labs Lab 08/23/14 0307  WBC 6.1  HGB 14.2  HCT 40.0  PLT 175   ------------------------------------------------------------------------------------------------------------------  Chemistries   Recent Labs Lab 08/22/14 1710 08/23/14 0307  NA  --  140  K  --  3.2*  CL  --  105  CO2  --  27  GLUCOSE  --  126*  BUN  --  5*  CREATININE  --  0.49  CALCIUM  --  8.8*  MG 1.5*  --    ------------------------------------------------------------------------------------------------------------------  Cardiac Enzymes  Recent Labs Lab 08/22/14 2253 08/23/14 0307  TROPONINI <0.03 <0.03   ------------------------------------------------------------------------------------------------------------------  RADIOLOGY:  Dg Chest Port 1 View  08/22/2014   CLINICAL DATA:  New onset atrial fibrillation with shortness of breath  EXAM: PORTABLE CHEST - 1 VIEW  COMPARISON:  April 11, 2006  FINDINGS: There is no edema or consolidation. The heart size and pulmonary vascularity normal. No adenopathy. No bone lesions. There is mild apical pleural thickening on the right, stable.  IMPRESSION: No edema or consolidation.  No change in cardiac silhouette.   Electronically Signed   By: Bretta Bang III M.D.   On: 08/22/2014 15:53    ASSESSMENT AND PLAN:   * A. fib with RVR normal TSH level, monitor on telemetry and followed and stable serial troponin. Checked echocardiogram. She was on Cardizem IV drip, monitor on  stepdown unit, appreciated cardiology consult.  As continued to have rapid response- switched to amiodarone drip, now rate is  reasonably under control, still have a fib. Give lovenox therapeutic dose for now.  * hypomagnesemia   Replace IV - recheck tomorrow.  * hypokalemia   Replace oral, recheck tomorrow.  * Anxiety continue home medication.  * Smoking Counseled to quit smoking for 4 minutes she would like to nicotine patch here.  * Chronic alcoholism She drinks 5-7 beers every evening, last drink was last night. But she says she never going withdrawal if she don't drink for a day or 2. Currently there are no signs of withdrawal or anxiety but will continue watching for that.   All the records are reviewed and case discussed with Care Management/Social Workerr. Management plans discussed with the patient, family and they are in agreement.  CODE STATUS: full  TOTAL TIME TAKING CARE OF THIS PATIENT: 35 minutes.     POSSIBLE D/C IN 1-2 DAYS, DEPENDING ON CLINICAL CONDITION.   Altamese Dilling M.D on 08/23/2014   Between 7am to 6pm - Pager - [phone]  After 6pm go to [email or web address] - password [email or web address] [facility]  [place] [facility]  Office  [phone]  CC: Primary care physician; No primary care provider on file.

## 2014-08-23 NOTE — Progress Notes (Signed)
Patient transferred from CCU. Oriented to room, call bell, and staff. Bed in lowest position. Fall safety plan reviewed. Full assessment to Epic. Skin assessment verified with Teodoro Kil. Telemetry box verification with tele clerk- Box#: 23. Will continue to monitor. Amiodarone drip infusing upon transfer. Ariel Hensley

## 2014-08-23 NOTE — Progress Notes (Signed)
Patient alert and oriented- Afib on monitor- controlled with PVC's- Pt on amiodarone drip- Replaced potassium and magnesium- recheck in am- order for ambien for tonight. Pt to transfer to room 253.

## 2014-08-23 NOTE — Progress Notes (Signed)
SUBJECTIVE: Ariel Hensley is still feeling very short of breath and has some palpitation and chest pain.    Filed Vitals:   08/23/14 0500 08/23/14 0600 08/23/14 0700 08/23/14 0800  BP: 111/73 123/84 97/74 97/65   Pulse: 45 104 84 84  Temp:    97.7 F (36.5 C)  TempSrc:    Oral  Resp: Height:      Weight:      SpO2: 96% 98% 97% 97%    Intake/Output Summary (Last 24 hours) at 08/23/14 1212 Last data filed at 08/23/14 1150  Gross per 24 hour  Intake 2305.08 ml  Output    850 ml  Net 1455.08 ml    LABS: Basic Metabolic Panel:  Recent Labs  16/10/96 1534 08/22/14 1710 08/23/14 0307  NA 140  --  140  K 3.5  --  3.2*  CL 106  --  105  CO2 21*  --  27  GLUCOSE 107*  --  126*  BUN 5*  --  5*  CREATININE 0.49  --  0.49  CALCIUM 9.0  --  8.8*  MG  --  1.5*  --   PHOS  --  3.4  --    Liver Function Tests: No results for input(s): AST, ALT, ALKPHOS, BILITOT, PROT, ALBUMIN in the last 72 hours. No results for input(s): LIPASE, AMYLASE in the last 72 hours. CBC:  Recent Labs  08/22/14 1534 08/23/14 0307  WBC 7.2 6.1  NEUTROABS 5.0  --   HGB 15.8 14.2  HCT 45.7 40.0  MCV 100.8* 102.4*  PLT 216 175   Cardiac Enzymes:  Recent Labs  08/22/14 1718 08/22/14 2253 08/23/14 0307  TROPONINI <0.03 <0.03 <0.03   BNP: Invalid input(s): POCBNP D-Dimer: No results for input(s): DDIMER in the last 72 hours. Hemoglobin A1C: No results for input(s): HGBA1C in the last 72 hours. Fasting Lipid Panel: No results for input(s): CHOL, HDL, LDLCALC, TRIG, CHOLHDL, LDLDIRECT in the last 72 hours. Thyroid Function Tests:  Recent Labs  08/22/14 1710  TSH 1.760   Anemia Panel: No results for input(s): VITAMINB12, FOLATE, FERRITIN, TIBC, IRON, RETICCTPCT in the last 72 hours.   PHYSICAL EXAM General: Well developed, well nourished, in no acute distress HEENT:  Normocephalic and atramatic Neck:  No JVD.  Lungs: Clear bilaterally to auscultation and  percussion. Heart: HRRR . Normal S1 and S2 without gallops or murmurs.  Abdomen: Bowel sounds are positive, abdomen soft and non-tender  Msk:  Back normal, normal gait. Normal strength and tone for age. Extremities: No clubbing, cyanosis or edema.   Neuro: Alert and oriented X 3. Psych:  Good affect, responds appropriately  TELEMETRY: Monitor shows atrial fibrillation with ventricular response rate of 90/m  ASSESSMENT AND PLAN: Atrial fibrillation with rapid ventricular response rate on admission with associated chest pain palpitation and dizziness. Amiodarone IV has not converted the patient to sinus rhythm yet. If this continues without sinus rhythm by tomorrow we will consider electrical cardioversion.  Principal Problem:   Atrial fibrillation with rapid ventricular response Active Problems:   Atrial fibrillation    Azya Barbero A, MD, [facility] 08/23/2014 12:12 PM

## 2014-08-23 NOTE — Progress Notes (Signed)
RN notified MD Dr. Anne Hahn about patient's mild pain score, helped by ice application but not completed abated. MD to order tylenol.

## 2014-08-23 NOTE — Progress Notes (Signed)
ANTICOAGULATION CONSULT NOTE   Pharmacy Consult for Lovenox Indication: atrial fibrillation  No Known Allergies  Patient Measurements: Height:  (172.7 cm) Weight: 113 lb 5.1 oz (51.4 kg) IBW/kg (Calculated) : 63.9  Labs:  Recent Labs  08/22/14 1534 08/22/14 1718 08/22/14 2253 08/23/14 0307  HGB 15.8  --   --  14.2  HCT 45.7  --   --  40.0  PLT 216  --   --  175  CREATININE 0.49  --   --  0.49  TROPONINI <0.03 <0.03 <0.03 <0.03    Estimated Creatinine Clearance: 70.5 mL/min (by C-G formula based on Cr of 0.49).  Assessment: Pharmacy consulted to dose lovenox for afib  Goal of Therapy:  VTE prophylaxis Monitor platelets by anticoagulation protocol: Yes   Plan:  Will continue enoxaparin  SQ Q12H  Will monitor CBC daily.   Garlon Hatchet, PharmD Clinical Pharmacist  08/23/2014,8:31 AM

## 2014-08-23 NOTE — Progress Notes (Signed)
RN notified MD on call (Dr. Betti Cruz), that patient reports headache (pt states because she feels "like I haven't slept well") and tylenol not available to give patient. Patient also reported feeling anxious and while listed home med dose of valium is every six hours, hospital prescribed dose was every 8 hours and thus not available. MD ordered one time dose of ibuprofen and one time dose of valium for patient.

## 2014-08-24 LAB — BASIC METABOLIC PANEL
Anion gap: 9 (ref 5–15)
BUN: 7 mg/dL (ref 6–20)
CO2: 24 mmol/L (ref 22–32)
Calcium: 8.8 mg/dL — ABNORMAL LOW (ref 8.9–10.3)
Chloride: 105 mmol/L (ref 101–111)
Creatinine, Ser: 0.53 mg/dL (ref 0.44–1.00)
GFR calc Af Amer: >60 mL/min (ref >60)
GFR calc non Af Amer: >60 mL/min (ref >60)
Glucose, Bld: 103 mg/dL — ABNORMAL HIGH (ref 65–99)
Potassium: 3.6 mmol/L (ref 3.5–5.1)
Sodium: 138 mmol/L (ref 135–145)

## 2014-08-24 LAB — MAGNESIUM: Magnesium: 1.9 mg/dL (ref 1.7–2.4)

## 2014-08-24 MED ORDER — ASPIRIN EC 81 MG PO TBEC: 81.0000 mg | DELAYED_RELEASE_TABLET | Freq: Every day | ORAL | Status: DC

## 2014-08-24 MED ORDER — ZOLPIDEM TARTRATE 5 MG PO TABS: 5.0000 mg | ORAL_TABLET | Freq: Every evening | ORAL | Status: DC | PRN

## 2014-08-24 MED ORDER — AMIODARONE HCL 200 MG PO TABS
800.0000 mg | ORAL_TABLET | Freq: Every day | ORAL | Status: DC
  Administered 2014-08-24: 800 mg via ORAL
  Filled 2014-08-24: qty 4

## 2014-08-24 MED ORDER — AMIODARONE HCL 200 MG PO TABS: ORAL_TABLET | ORAL | Status: DC

## 2014-08-24 NOTE — Progress Notes (Signed)
SUBJECTIVE: Patient is feeling much better and denies chest pain palpitation or shortness of breath   Filed Vitals:   08/23/14 2036 08/24/14 0027 08/24/14 0512 08/24/14 0816  BP: 120/88 126/81 121/87 120/84  Pulse: 79 75 69 72  Temp: 98.1 F (36.7 C)  97.4 F (36.3 C) 98.4 F (36.9 C)  TempSrc: Oral   Oral  Resp: 18 18 18 18   Height:      Weight:      SpO2: 100% 98% 99% 98%    Intake/Output Summary (Last 24 hours) at 08/24/14 1224 Last data filed at 08/24/14 0950  Gross per 24 hour  Intake  322.6 ml  Output   2850 ml  Net -2527.4 ml    LABS: Basic Metabolic Panel:  Recent Labs  40/98/11 1710 08/23/14 0307 08/24/14 0444  NA  --  140 138  K  --  3.2* 3.6  CL  --  105 105  CO2  --  27 24  GLUCOSE  --  126* 103*  BUN  --  5* 7  CREATININE  --  0.49 0.53  CALCIUM  --  8.8* 8.8*  MG 1.5*  --  1.9  PHOS 3.4  --   --    Liver Function Tests: No results for input(s): AST, ALT, ALKPHOS, BILITOT, PROT, ALBUMIN in the last 72 hours. No results for input(s): LIPASE, AMYLASE in the last 72 hours. CBC:  Recent Labs  08/22/14 1534 08/23/14 0307  WBC 7.2 6.1  NEUTROABS 5.0  --   HGB 15.8 14.2  HCT 45.7 40.0  MCV 100.8* 102.4*  PLT 216 175   Cardiac Enzymes:  Recent Labs  08/22/14 1718 08/22/14 2253 08/23/14 0307  TROPONINI <0.03 <0.03 <0.03   BNP: Invalid input(s): POCBNP D-Dimer: No results for input(s): DDIMER in the last 72 hours. Hemoglobin A1C: No results for input(s): HGBA1C in the last 72 hours. Fasting Lipid Panel: No results for input(s): CHOL, HDL, LDLCALC, TRIG, CHOLHDL, LDLDIRECT in the last 72 hours. Thyroid Function Tests:  Recent Labs  08/22/14 1710  TSH 1.760   Anemia Panel: No results for input(s): VITAMINB12, FOLATE, FERRITIN, TIBC, IRON, RETICCTPCT in the last 72 hours.   PHYSICAL EXAM General: Well developed, well nourished, in no acute distress HEENT:  Normocephalic and atramatic Neck:  No JVD.  Lungs: Clear bilaterally  to auscultation and percussion. Heart: HRRR . Normal S1 and S2 without gallops or murmurs.  Abdomen: Bowel sounds are positive, abdomen soft and non-tender  Msk:  Back normal, normal gait. Normal strength and tone for age. Extremities: No clubbing, cyanosis or edema.   Neuro: Alert and oriented X 3. Psych:  Good affect, responds appropriately  TELEMETRY: Monitor shows sinus rhythm about 60-70 bpm  ASSESSMENT AND PLAN: Atrial fibrillation with rapid ventricular response rate, patient has finally converted to sinus rhythm and the electrical cardioversion is not needed. Will discontinue order for cardioversion tomorrow and switch amiodarone  800 mg once a day. We will discontinue the drip of amiodarone. Once she walks and is doing fine may be discharged home on tapering dose of amiodarone. Tapering dose of amiodarone would be 800 mg once a day for 5 days, then 600 mg once a day for 5 days, and then 400 mg once a day for 5 days. After after that 200 mg as maintenance dosage. Patient is low risk for stroke with chads score being 0, thus should be sent home on aspirin. Principal Problem:   Atrial fibrillation with rapid ventricular  response Active Problems:   Atrial fibrillation    Laurier Nancy, MD, [facility] 08/24/2014 12:24 PM

## 2014-08-24 NOTE — Progress Notes (Signed)
Dr. Elisabeth Pigeon notified that patient has stayed sinus rhythm and states she is feeling well. Orders to Proceed with discharge.  Patient given discharge teaching and paperwork regarding medications, diet, follow-up appointments and activity. Patient understanding verbalized. No complaints at this time. IV and telemetry discontinued prior to leaving. Skin assessment as previously charted and vitals are stable. Patient being discharged to home. Caregiver/family present during discharge teaching. No further needs by Care Management, per Larita Fife.  Adella Nissen

## 2014-08-24 NOTE — Progress Notes (Signed)
Dr. Madelon Lips aware that patient converted to Sinus Rhythm overnight.

## 2014-08-24 NOTE — Progress Notes (Signed)
Per Dr. Welton Flakes, cancel cardioversion order set and order  PO amio daily starting now. Stop drip once first dose of PO amio is given.

## 2014-08-24 NOTE — Progress Notes (Signed)
Pt walked around nursing station with nurse tech. Remained sinus rhythm 80's. Dr. Madelon Lips notified - orders to continue to monitor patient, let her leave after dinner (no earlier than 5:30) as long as she remains in sinus rhythm. Adella Nissen

## 2014-08-24 NOTE — Progress Notes (Signed)
Per Dr. Humphrey Rolls, place order for MET C to be drawn now, order nephrology consult for renal insufficiency

## 2014-08-24 NOTE — Progress Notes (Signed)
Pt refusing bed alarm - educated on safety

## 2014-08-24 NOTE — Discharge Summary (Signed)
[facility] Physicians - [place] at [facility]   PATIENT NAME: Ariel Hensley    MR#:  [id number]  DATE OF BIRTH:  Nov 15, 1965  DATE OF ADMISSION:  08/22/2014 ADMITTING PHYSICIAN: Altamese Dilling, MD  DATE OF DISCHARGE: 08/24/2014  PRIMARY CARE PHYSICIAN: No primary care provider on file.    ADMISSION DIAGNOSIS:  New onset atrial fibrillation [I48.91] Atrial fibrillation with rapid ventricular response [I48.91]  DISCHARGE DIAGNOSIS:  Principal Problem:   Atrial fibrillation with rapid ventricular response Active Problems:   Atrial fibrillation   SECONDARY DIAGNOSIS:   Past Medical History  Diagnosis Date  . Anxiety     HOSPITAL COURSE:   * A. fib with RVR normal TSH level, monitor on telemetry and followed and stable serial troponin. Checked echocardiogram. She was on Cardizem IV drip, monitor on stepdown unit, appreciated cardiology consult. As continued to have rapid response- switched to amiodarone drip, now rate is reasonably under control,  Give lovenox therapeutic dose for now.  Finally on 08/24/14 morning converted to sinus rhythm- and cardiologist swiched to oral amio.  he suggested to give aspirin due to low CHADS score.  * hypomagnesemia  Replaced IV.  * hypokalemia  Replaced oral.  * Anxiety continue home medication.  * Smoking Counseled to quit smoking for 4 minutes she would like to nicotine patch here.  * Chronic alcoholism She drinks 5-7 beers every evening, last drink was last night. But she says she never going withdrawal if she don't drink for a day or 2. Currently there are no signs of withdrawal or anxiety but will continue watching for that.  DISCHARGE CONDITIONS:   Stable.  CONSULTS OBTAINED:  Treatment Team:  Laurier Nancy, MD  DRUG ALLERGIES:  No Known Allergies  DISCHARGE MEDICATIONS:   Current Discharge Medication List    START taking these medications   Details  amiodarone (PACERONE) 200 MG  tablet Take 4 tabs daily for first 4 days, then 3 tabs daily for next 5 days, then 2 tabs daily for next 5 days and then 1 tab daily- keep taking 200 mg daily at baseline then after. Qty: 100 tablet, Refills: 0    aspirin EC 81 MG tablet Take 1 tablet (81 mg total) by mouth daily. Qty: 30 tablet, Refills: 0    zolpidem (AMBIEN) 5 MG tablet Take 1 tablet (5 mg total) by mouth at bedtime as needed for sleep. Qty: 10 tablet, Refills: 0      CONTINUE these medications which have NOT CHANGED   Details  Aspirin-Salicylamide-Caffeine (BC HEADACHE PO) Take 1 packet by mouth daily as needed (for headaches).    diazepam (VALIUM) 10 MG tablet Take 5 mg by mouth every 6 (six) hours as needed for anxiety.         DISCHARGE INSTRUCTIONS:  folow with Dr. Welton Flakes in office in 1 week.  If you experience worsening of your admission symptoms, develop shortness of breath, life threatening emergency, suicidal or homicidal thoughts you must seek medical attention immediately by calling 911 or calling your MD immediately  if symptoms less severe.  You Must read complete instructions/literature along with all the possible adverse reactions/side effects for all the Medicines you take and that have been prescribed to you. Take any new Medicines after you have completely understood and accept all the possible adverse reactions/side effects.   Please note  You were cared for by a hospitalist during your hospital stay. If you have any questions about your discharge medications or the care  you received while you were in the hospital after you are discharged, you can call the unit and asked to speak with the hospitalist on call if the hospitalist that took care of you is not available. Once you are discharged, your primary care physician will handle any further medical issues. Please note that NO REFILLS for any discharge medications will be authorized once you are discharged, as it is imperative that you return to your  primary care physician (or establish a relationship with a primary care physician if you do not have one) for your aftercare needs so that they can reassess your need for medications and monitor your lab values.    Today   CHIEF COMPLAINT:   Chief Complaint  Patient presents with  . Shortness of Breath  . Palpitations    HISTORY OF PRESENT ILLNESS:  Ariel Hensley  is a 49 y.o. female with a known history of anxiety but no other medical issues and she follows regularly with [facility] for a physical checkup not on any prescription medications. Today suddenly started having palpitation episodes which was not stopping she tried to relax for a while but did not help it. This episode happened while she was driving just a short distance and no any exertional activities, she denies drinking excessive coffee or caffeinated drinks or energy drinks, excessive smoking, excessive exercise in the last 24 hours. She had similar episodes in the past but it just lasted for like a few seconds and she is fine after that. But today's episode was not stopping she tried to relax and drink some water continued to have the palpitation with chest tightness and feeling of shortness of breath so she decided to call ambulance. EMS gave her the adenosine injections 3. Her heart rate was up to 200 with EMS. In ER her heart rate was still 150s and so she received injection Cardizem twice with short-term response to that but again going back up to 120 and 1:30 heart rate and she continued to be in atrial fibrillation. Her blood pressure is stable and she does not have any chest pain. She was started on Cardizem drip by ER physician and given for admission to hospitalist team.   VITAL SIGNS:  Blood pressure 116/77, pulse 64, temperature 97.7 F (36.5 C), temperature source Oral, resp. rate 18, height  (1.727 m), weight 51.4 kg (113 lb 5.1 oz), SpO2 100 %.  I/O:   Intake/Output Summary (Last 24 hours) at 08/24/14  1358 Last data filed at 08/24/14 1322  Gross per 24 hour  Intake  305.9 ml  Output   3550 ml  Net -3244.1 ml    PHYSICAL EXAMINATION:   GENERAL: 49 y.o.-year-old thin patient lying in the bed with no acute distress.  EYES: Pupils equal, round, reactive to light and accommodation. No scleral icterus. Extraocular muscles intact.  HEENT: Head atraumatic, normocephalic. Oropharynx and nasopharynx clear.  NECK: Supple, no jugular venous distention. No thyroid enlargement, no tenderness.  LUNGS: Normal breath sounds bilaterally, no wheezing, rales,rhonchi or crepitation. No use of accessory muscles of respiration.  CARDIOVASCULAR: S1, S2 normal, fast and irregular. No murmurs.  ABDOMEN: Soft, nontender, nondistended. Bowel sounds present. No organomegaly or mass.  EXTREMITIES: No pedal edema, cyanosis, or clubbing.  NEUROLOGIC: Cranial nerves II through XII are intact. Muscle strength 5/5 in all extremities. Sensation intact. Gait not checked.  PSYCHIATRIC: The patient is alert and oriented x 3.  SKIN: No obvious rash, lesion, or ulcer.   DATA REVIEW:  CBC  Recent Labs Lab 08/23/14 0307  WBC 6.1  HGB 14.2  HCT 40.0  PLT 175    Chemistries   Recent Labs Lab 08/24/14 0444  NA 138  K 3.6  CL 105  CO2 24  GLUCOSE 103*  BUN 7  CREATININE 0.53  CALCIUM 8.8*  MG 1.9    Cardiac Enzymes  Recent Labs Lab 08/23/14 0307  TROPONINI <0.03    Microbiology Results  Results for orders placed or performed during the hospital encounter of 08/22/14  MRSA PCR Screening     Status: None   Collection Time: 08/22/14  6:14 PM  Result Value Ref Range Status   MRSA by PCR NEGATIVE NEGATIVE Final    Comment:        The GeneXpert MRSA Assay (FDA approved for NASAL specimens only), is one component of a comprehensive MRSA colonization surveillance program. It is not intended to diagnose MRSA infection nor to guide or monitor treatment for MRSA infections.      RADIOLOGY:  Dg Chest Port 1 View  08/22/2014   CLINICAL DATA:  New onset atrial fibrillation with shortness of breath  EXAM: PORTABLE CHEST - 1 VIEW  COMPARISON:  April 11, 2006  FINDINGS: There is no edema or consolidation. The heart size and pulmonary vascularity normal. No adenopathy. No bone lesions. There is mild apical pleural thickening on the right, stable.  IMPRESSION: No edema or consolidation.  No change in cardiac silhouette.   Electronically Signed   By: Bretta Bang III M.D.   On: 08/22/2014 15:53     Management plans discussed with the patient, family and they are in agreement.  CODE STATUS:     Code Status Orders        Start     Ordered   08/22/14 1747  Full code   Continuous     08/22/14 1746      TOTAL TIME TAKING CARE OF THIS PATIENT: 35inutes.    Altamese Dilling M.D on 08/24/2014 at 1:58 PM  Between 7am to 6pm - Pager - [phone]  After 6pm go to [email or web address] - password [email or web address]  [place] [facility]  Office  [phone]  CC: Primary care physician; No primary care provider on file.

## 2014-08-24 NOTE — Progress Notes (Signed)
RN followed up with Dr. Welton Flakes regarding orders given - discontinue STAT BMP and renal consult - not necessary

## 2014-09-24 ENCOUNTER — Encounter: Payer: Self-pay | Admitting: *Deleted

## 2014-09-24 ENCOUNTER — Ambulatory Visit
Admission: RE | Admit: 2014-09-24 | Discharge: 2014-09-24 | Disposition: A | Discharge status: Home / Self Care | Payer: Self-pay | Source: Ambulatory Visit | Attending: Oncology | Admitting: Oncology

## 2014-09-24 ENCOUNTER — Ambulatory Visit: Payer: Self-pay | Attending: Oncology | Admitting: *Deleted

## 2014-09-24 ENCOUNTER — Encounter (INDEPENDENT_AMBULATORY_CARE_PROVIDER_SITE_OTHER): Payer: Self-pay

## 2014-09-24 VITALS — BP 144/84 | HR 53 | Temp 98.1°F | Ht 67.0 in | Wt 118.3 lb

## 2014-09-24 DIAGNOSIS — Z0000 Encounter for general adult medical examination without abnormal findings: Secondary | ICD-10-CM

## 2014-09-24 NOTE — Progress Notes (Signed)
Letter mailed from [facility] to inform patient of her normal mammogram results.  Patient is to follow-up with annual screening in one year.  HSIS to Christy. 

## 2014-09-24 NOTE — Progress Notes (Signed)
Subjective:     Patient ID: Ariel Hensley, female   DOB: 1965-09-12, 49 y.o.   MRN: [id number]  HPI   Review of Systems     Objective:   Physical Exam  Pulmonary/Chest: Right breast exhibits tenderness. Right breast exhibits no inverted nipple, no mass, no nipple discharge and no skin change. Left breast exhibits tenderness. Breasts are symmetrical.    Bilateral breast tender to palpation       Assessment:     49 year old White female presents to [facility] for clinical breast exam, and mammogram.  Had pap smear 05/2014.  Clinical breast exam unremarkable.  Taught self breast awareness.  Patient has been screened for eligibility.  She does not have any insurance, Medicare or Medicaid.  She also meets financial eligibility.  Hand-out given on the Affordable Care Act.     Plan:  Screening mammogram ordered.  Follow up per protocol.

## 2014-09-24 NOTE — Patient Instructions (Signed)
Gave patient hand-out, Women Staying Healthy, Active and Well from [facility], with education on breast health, pap smears, heart and colon health. 

## 2016-02-25 ENCOUNTER — Emergency Department
Admission: EM | Admit: 2016-02-25 | Discharge: 2016-02-25 | Disposition: A | Discharge status: Home / Self Care | Payer: Self-pay | Attending: Emergency Medicine | Admitting: Emergency Medicine

## 2016-02-25 ENCOUNTER — Encounter: Payer: Self-pay | Admitting: Emergency Medicine

## 2016-02-25 ENCOUNTER — Emergency Department: Payer: Self-pay

## 2016-02-25 DIAGNOSIS — F17200 Nicotine dependence, unspecified, uncomplicated: Secondary | ICD-10-CM | POA: Insufficient documentation

## 2016-02-25 DIAGNOSIS — I10 Essential (primary) hypertension: Secondary | ICD-10-CM | POA: Insufficient documentation

## 2016-02-25 DIAGNOSIS — R0602 Shortness of breath: Secondary | ICD-10-CM | POA: Insufficient documentation

## 2016-02-25 DIAGNOSIS — R42 Dizziness and giddiness: Secondary | ICD-10-CM | POA: Insufficient documentation

## 2016-02-25 DIAGNOSIS — Z79899 Other long term (current) drug therapy: Secondary | ICD-10-CM | POA: Insufficient documentation

## 2016-02-25 DIAGNOSIS — R002 Palpitations: Secondary | ICD-10-CM | POA: Insufficient documentation

## 2016-02-25 HISTORY — DX: Essential (primary) hypertension: I10

## 2016-02-25 HISTORY — DX: Unspecified atrial fibrillation: I48.91

## 2016-02-25 LAB — BASIC METABOLIC PANEL
Anion gap: 12 (ref 5–15)
BUN: 6 mg/dL (ref 6–20)
CO2: 24 mmol/L (ref 22–32)
Calcium: 9.6 mg/dL (ref 8.9–10.3)
Chloride: 101 mmol/L (ref 101–111)
Creatinine, Ser: 0.76 mg/dL (ref 0.44–1.00)
GFR calc Af Amer: >60 mL/min (ref >60)
GFR calc non Af Amer: >60 mL/min (ref >60)
Glucose, Bld: 90 mg/dL (ref 65–99)
Potassium: 4.1 mmol/L (ref 3.5–5.1)
Sodium: 137 mmol/L (ref 135–145)

## 2016-02-25 LAB — CBC
HCT: 50 % — ABNORMAL HIGH (ref 35.0–47.0)
Hemoglobin: 17 g/dL — ABNORMAL HIGH (ref 12.0–16.0)
MCH: 34.7 pg — ABNORMAL HIGH (ref 26.0–34.0)
MCHC: 34.1 g/dL (ref 32.0–36.0)
MCV: 101.9 fL — ABNORMAL HIGH (ref 80.0–100.0)
Platelets: 225 10*3/uL (ref 150–440)
RBC: 4.91 MIL/uL (ref 3.80–5.20)
RDW: 13.2 % (ref 11.5–14.5)
WBC: 5.2 10*3/uL (ref 3.6–11.0)

## 2016-02-25 LAB — TSH: TSH: 1.115 u[IU]/mL (ref 0.350–4.500)

## 2016-02-25 LAB — TROPONIN I: Troponin I: <0.03 ng/mL (ref <0.03)

## 2016-02-25 MED ORDER — AMIODARONE HCL 200 MG PO TABS
400.0000 mg | ORAL_TABLET | Freq: Once | ORAL | Status: DC
  Filled 2016-02-25: qty 2

## 2016-02-25 MED ORDER — AMIODARONE HCL 400 MG PO TABS: ORAL_TABLET | ORAL | 0 refills | Status: DC

## 2016-02-25 MED ORDER — METOPROLOL SUCCINATE ER 50 MG PO TB24: 50.0000 mg | ORAL_TABLET | Freq: Every day | ORAL | 0 refills | Status: DC

## 2016-02-25 MED ORDER — METOPROLOL SUCCINATE ER 50 MG PO TB24
50.0000 mg | ORAL_TABLET | Freq: Every day | ORAL | Status: DC
  Administered 2016-02-25: 50 mg via ORAL
  Filled 2016-02-25: qty 1

## 2016-02-25 MED ORDER — ASPIRIN 81 MG PO CHEW
324.0000 mg | CHEWABLE_TABLET | Freq: Once | ORAL | Status: AC
  Administered 2016-02-25: 324 mg via ORAL
  Filled 2016-02-25: qty 4

## 2016-02-25 MED ORDER — ASPIRIN EC 81 MG PO TBEC: 81.0000 mg | DELAYED_RELEASE_TABLET | Freq: Every day | ORAL | 0 refills | Status: AC

## 2016-02-25 NOTE — ED Triage Notes (Signed)
Pt arrived via EMS from home for reports of chest palpitations. Pt has known a-fib but has been out of amiodarone and metoprolol for three months. EMS reports ST, initial HR 130 then down to 90, 136/80, 97 %RA.

## 2016-02-25 NOTE — ED Provider Notes (Signed)
[facility] Emergency Department Provider Note  ____________________________________________  Time seen: Approximately 4:33 PM  I have reviewed the triage vital signs and the nursing notes.   HISTORY  Chief Complaint Tachycardia    HPI Ronnell FreshwaterJamie L Downie is a 51 y.o. female female with a history of paroxysmal atrial fibrillation not anticoagulated presenting with palpitations, lightheadedness and shortness of breath. The patient reports that for the last 3 months she has been unable to fill her prescriptions for amiodarone and metoprolol due to insurance issues. Over the past week, she has had multiple daily episodes that last approximate 5 minutes and self resolved of palpitations "like my heart is beating out of my chest" associated with lightheadedness and mild shortness breath. No nausea or vomiting, diaphoresis.On arrival, EMS noted the patient have a heart rate in the 120s, which spontaneously resolved into the 80s. No medications were administered.   Past Medical History:  Diagnosis Date  . Anxiety   . Atrial fibrillation (HCC)   . Hypertension     Patient Active Problem List   Diagnosis Date Noted  . Atrial fibrillation with rapid ventricular response (HCC) 08/22/2014  . Atrial fibrillation (HCC) 08/22/2014    Past Surgical History:  Procedure Laterality Date  . TUBAL LIGATION      Current Outpatient Rx  . Order #: [id number] Class: Print  . Order #: [id number] Class: Print  . Order #: [id number] Class: Historical Med  . Order #: [id number] Class: Historical Med  . Order #: [id number] Class: Print    Allergies Patient has no known allergies.  Family History  Problem Relation Age of Onset  . Diabetes Mother   . Diabetes Maternal Grandmother   . Heart attack Paternal Grandfather   . Breast cancer Paternal Aunt [id number]    2 pat aunts    Social History Social History  Substance Use Topics  . Smoking status: Current Every Day Smoker    Packs/day:  1.50  . Smokeless tobacco: Never Used  . Alcohol use 3.0 - 4.2 oz/week    5 - 7 Cans of beer per week    Review of Systems Constitutional: No fever/chills.Positive lightheadedness without syncope. Eyes: No visual changes. ENT: No sore throat. No congestion or rhinorrhea. Cardiovascular: Denies chest pain. Positive palpitations. Respiratory: Positive shortness of breath.  No cough. Gastrointestinal: No abdominal pain.  No nausea, no vomiting.  No diarrhea.  No constipation. Genitourinary: Negative for dysuria. Musculoskeletal: Negative for back pain. Skin: Negative for rash. Neurological: Negative for headaches. No focal numbness, tingling or weakness.   10-point ROS otherwise negative.  ____________________________________________   PHYSICAL EXAM:  VITAL SIGNS: ED Triage Vitals  Enc Vitals Group     BP 02/25/16 1404 127/76     Pulse Rate 02/25/16 1404 91     Resp 02/25/16 1404 18     Temp 02/25/16 1404 98.4 F (36.9 C)     Temp Source 02/25/16 1404 Oral     SpO2 02/25/16 1404 95 %     Weight 02/25/16 1405 118 lb (53.5 kg)     Height 02/25/16 1405 5\' 10"  (1.778 m)     Head Circumference --      Peak Flow --      Pain Score --      Pain Loc --      Pain Edu? --      Excl. in GC? --     Constitutional: Alert and oriented. Chronically ill appearing but in no acute distress. Answers questions appropriately. Smells of tobacco.  Eyes: Conjunctivae are normal.  EOMI. No scleral icterus. Head: Atraumatic. Nose: No congestion/rhinnorhea. Mouth/Throat: Mucous membranes are moist.  Neck: No stridor.  Supple.  No JVD. Cardiovascular: Normal rate, regular rhythm. No murmurs, rubs or gallops. Rate is in the 80s on my exam and regular. Respiratory: Normal respiratory effort.  No accessory muscle use or retractions. Lungs CTAB.  No wheezes, rales or ronchi. Gastrointestinal: Soft, nontender and nondistended.  No guarding or rebound.  No peritoneal signs. Musculoskeletal: No LE  edema. No ttp in the calves or palpable cords.  Negative Homan's sign. Neurologic:  A&Ox3.  Speech is clear.  Face and smile are symmetric.  EOMI.  Moves all extremities well. Skin:  Skin is warm, dry and intact. No rash noted. Psychiatric: Mood and affect are normal. Speech and behavior are normal.  Normal judgement.  ____________________________________________   LABS (all labs ordered are listed, but only abnormal results are displayed)  Labs Reviewed  CBC - Abnormal; Notable for the following:       Result Value   Hemoglobin 17.0 (*)    HCT 50.0 (*)    MCV 101.9 (*)    MCH 34.7 (*)    All other components within normal limits  BASIC METABOLIC PANEL  TSH   ____________________________________________  EKG  ED ECG REPORT I, Rockne Menghini, the attending physician, personally viewed and interpreted this ECG.   Date: 02/25/2016  EKG Time: 1407  Rate: 89  Rhythm: normal sinus rhythm  Axis: normal  Intervals:Prolonged QTc  ST&T Change: No STEMI  ____________________________________________  RADIOLOGY  Dg Chest 2 View  Result Date: 02/25/2016 CLINICAL DATA:  Palpitations, chest tightness, and shortness of breath associated with cough and congestion for the past week. Known atrial fibrillation and patient has run out of medicines over the past 3 months. Current smoker. EXAM: CHEST  2 VIEW COMPARISON:  Chest x-ray of August 22, 2014 FINDINGS: The lungs are mildly hyperinflated but clear there is stable biapical pleural thickening. The heart and pulmonary vascularity are normal. The mediastinum is normal in width. There is no pleural effusion. The bony thorax exhibits no acute abnormality. IMPRESSION: COPD. There is no pneumonia, CHF, nor other acute cardiopulmonary disease. Electronically Signed   By: David  [place] M.D.   On: 02/25/2016 15:31    ____________________________________________   PROCEDURES  Procedure(s) performed: None  Procedures  Critical Care  performed: No ____________________________________________   INITIAL IMPRESSION / ASSESSMENT AND PLAN / ED COURSE  Pertinent labs & imaging results that were available during my care of the patient were reviewed by me and considered in my medical decision making (see chart for details).  51 y.o. female with a history of paroxysmal A. fib presenting with intermittent episodes of palpitations, lightheadedness, and shortness of breath for the past week.  The most likely inciting event is medication noncompliance. We will also check her troponin, TSH, and talked with Dr. Park Breed for final disposition.  ----------------------------------------- 5:48 PM on 02/25/2016 -----------------------------------------  The patient has a reassuring workup in the emergency department. She has not had any tachycardia or abnormal rhythms here. Her troponin is negative, her electrolytes are within normal limits and her TSH is normal. Her chest x-ray does not show any acute cardiopulmonary process, and her EKG does not have ischemic changes or arrhythmia. I've spoken with Dr. Lennette Bihari, who is able to see the patient for follow-up in the morning at 8:30 AM in his clinic. I will give her first dose of amiodarone, metoprolol, and aspirin here,  and send her home with prescriptions for this. We additionally have given her information on a prescription aide service. Plan discharge. Return precautions as well as all up instructions were discussed.  ____________________________________________  FINAL CLINICAL IMPRESSION(S) / ED DIAGNOSES  Final diagnoses:  None         NEW MEDICATIONS STARTED DURING THIS VISIT:  New Prescriptions   No medications on file      Rockne Menghini, MD 02/25/16 1749

## 2016-02-25 NOTE — ED Notes (Signed)
Pt resting comfortably awaiting discharge

## 2016-02-25 NOTE — ED Notes (Signed)
Pt given info on prescription help [facility]. Awaiting for ed doc to speak with her pcp.

## 2016-02-25 NOTE — Discharge Instructions (Signed)
Please follow up with Dr. Lennette BihariKohn tomorrow morning at 8:30 AM.  Return to the emergency department if you develop chest pain, lightheadedness or fainting, or any other symptoms concerning to you.

## 2016-07-19 IMAGING — CR DG CHEST 1V PORT
1 series · 2 of 2 positions shown · non-contrast
Comparison: April 11, 2006

CLINICAL DATA: New onset atrial fibrillation with shortness of
breath

EXAM:
PORTABLE CHEST - 1 VIEW

[Series 1: ap · 0.17mm/px · 2 of 2 slices shown]
[im 1/2]
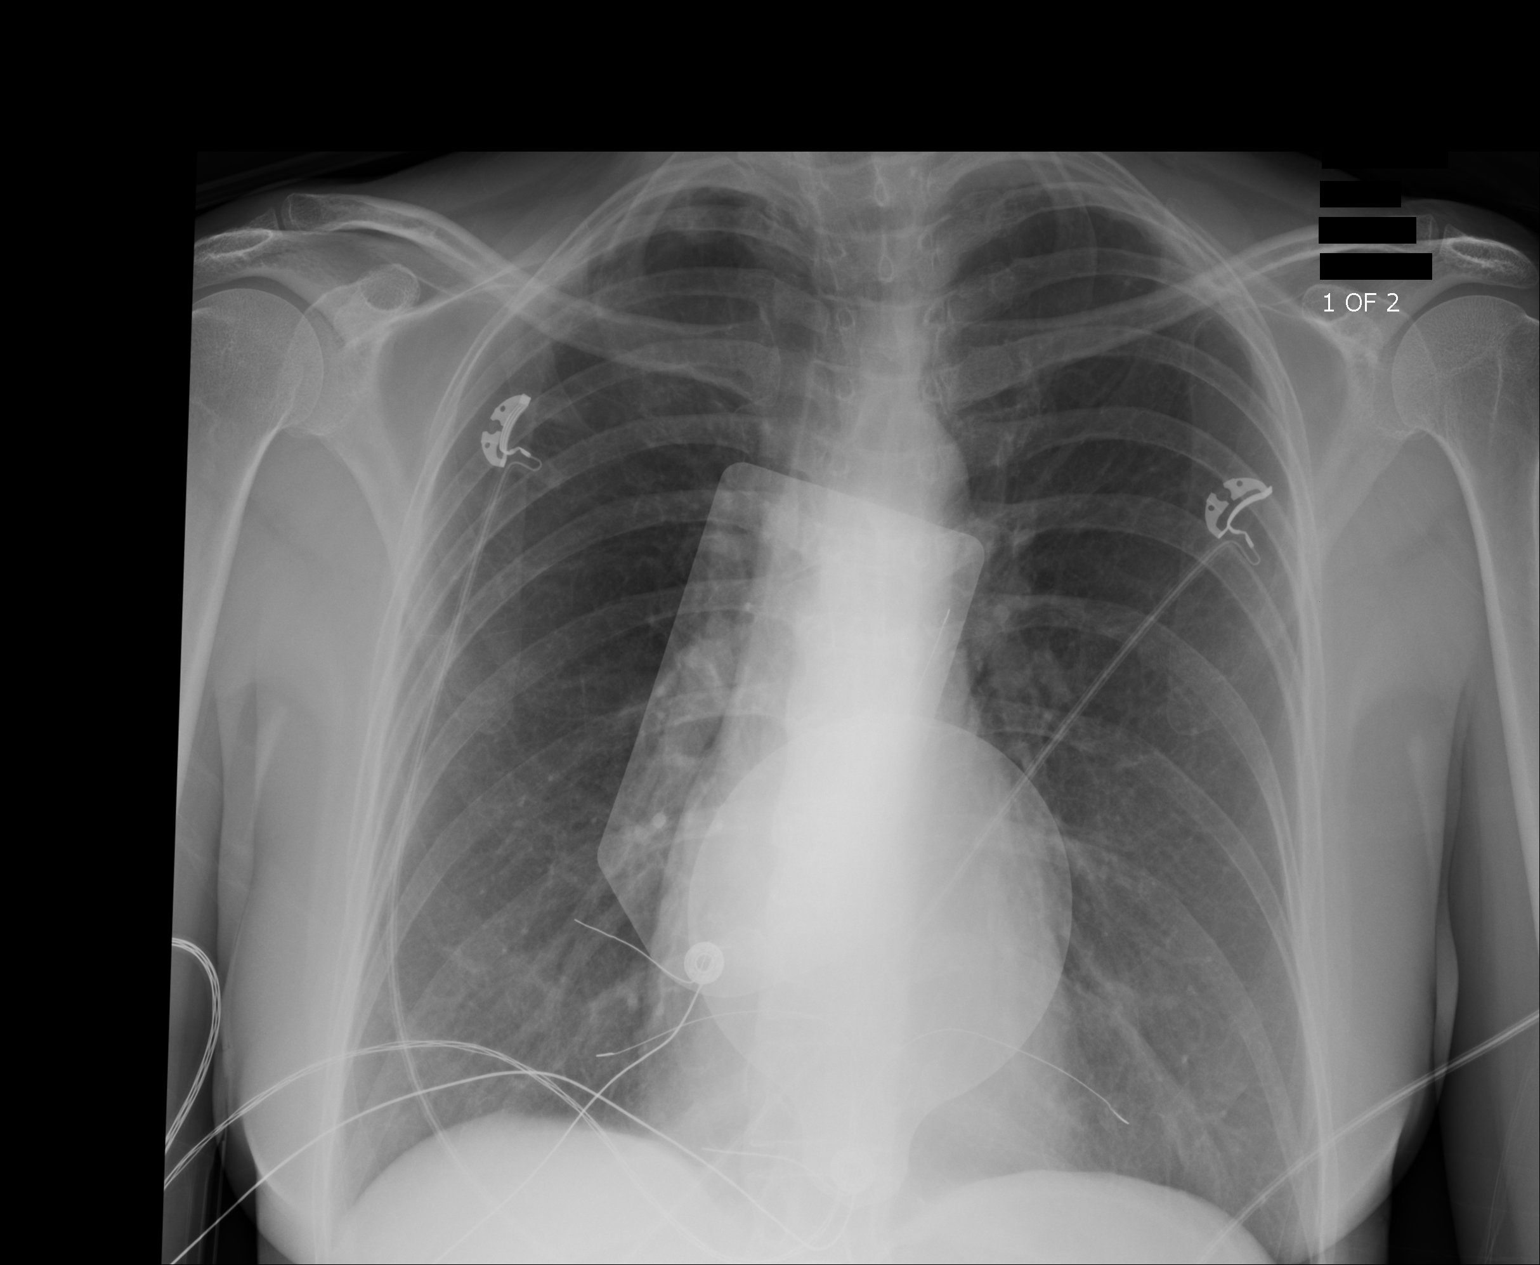
[im 2/2]
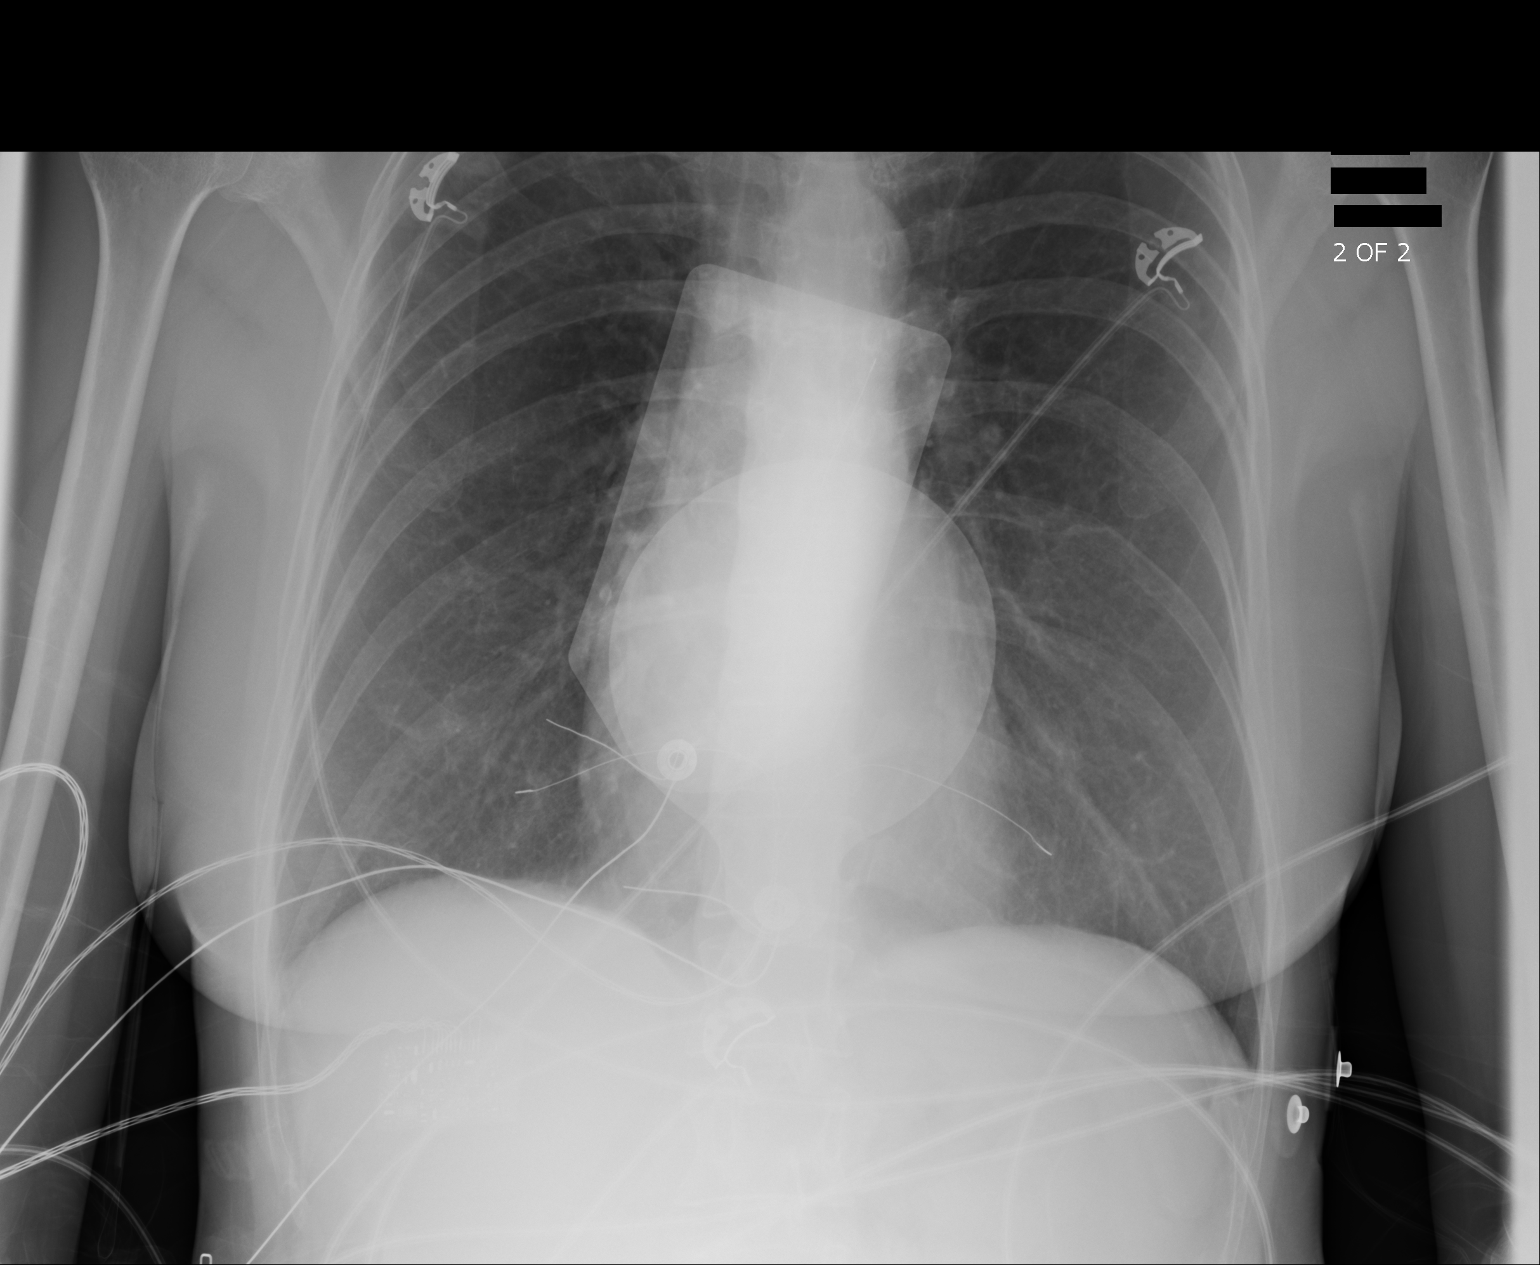

[2 of 2 positions shown; findings below may reference images not displayed]

FINDINGS: There is no edema or consolidation. The heart size and pulmonary
vascularity normal. No adenopathy. No bone lesions. There is mild
apical pleural thickening on the right, stable.
IMPRESSION: No edema or consolidation.  No change in cardiac silhouette.

## 2018-11-06 ENCOUNTER — Other Ambulatory Visit: Payer: Self-pay | Admitting: Family Medicine

## 2018-11-06 ENCOUNTER — Encounter: Payer: Self-pay | Admitting: Family Medicine

## 2018-11-06 DIAGNOSIS — Z1231 Encounter for screening mammogram for malignant neoplasm of breast: Secondary | ICD-10-CM

## 2018-12-17 ENCOUNTER — Other Ambulatory Visit: Payer: Self-pay

## 2018-12-17 DIAGNOSIS — Z20822 Contact with and (suspected) exposure to covid-19: Secondary | ICD-10-CM

## 2018-12-18 ENCOUNTER — Other Ambulatory Visit: Payer: Self-pay

## 2018-12-19 ENCOUNTER — Ambulatory Visit: Payer: Self-pay | Admitting: Gastroenterology

## 2018-12-19 LAB — NOVEL CORONAVIRUS, NAA: SARS-CoV-2, NAA: NOT DETECTED

## 2019-02-07 ENCOUNTER — Ambulatory Visit (INDEPENDENT_AMBULATORY_CARE_PROVIDER_SITE_OTHER): Payer: PRIVATE HEALTH INSURANCE | Admitting: Gastroenterology

## 2019-02-07 ENCOUNTER — Other Ambulatory Visit: Payer: Self-pay

## 2019-02-07 ENCOUNTER — Encounter: Payer: Self-pay | Admitting: Gastroenterology

## 2019-02-07 VITALS — BP 122/85 | HR 77 | Temp 97.3°F | Resp 17 | Wt 122.6 lb

## 2019-02-07 DIAGNOSIS — K529 Noninfective gastroenteritis and colitis, unspecified: Secondary | ICD-10-CM | POA: Diagnosis not present

## 2019-02-07 MED ORDER — DICYCLOMINE HCL 10 MG PO CAPS: 10.0000 mg | ORAL_CAPSULE | Freq: Three times a day (TID) | ORAL | 0 refills | Status: DC

## 2019-02-07 NOTE — Progress Notes (Signed)
Ariel Repress, MD [address]  [address]  [place], [place] [address]  Main: [phone]  Fax: [phone]    Gastroenterology Consultation  Referring Provider:     Shane Crutch, [place] Primary Care Physician:  Shane Crutch, [place] Primary Gastroenterologist:  Dr. Arlyss Hensley Reason for Consultation: Chronic diarrhea        HPI:   Ariel Hensley is a 54 y.o. female referred by Dr. Shane Crutch, PA  for consultation & management of unexplained diarrhea.  Patient reports that for at least 2 years, she has been experiencing severe postprandial diarrhea associated with abdominal cramps and bloating daily.  Cannot identify any particular food triggers.  She denies drinking coffee, carbonated beverages, does not eat chocolate regularly.  Her BMI is only 17.  She is not able to gain weight.  She denies rectal bleeding.  She does report epigastric burning pain, heartburn and takes Pepcid as needed.  She did not have abdominal surgeries.  She does smoke 1 pack/day for more than 20 years.  She does not drink alcohol.  Labs at [facility] from 10/2018 revealed normal CMP, TSH.  Hemoglobin was 16 in 09/2018, MCV 105  She does have history of anxiety, nervousness, panic attacks for which she is taking hydroxyzine, Celexa, Zoloft.  She was diagnosed with A. fib few years ago, was evaluated by cardiology, recommended to take aspirin 81 mg only due to low chads 2.  She lost insurance and did not follow-up with cardiology since then.  She says she now has insurance and has to see the cardiology.  NSAIDs: None  Antiplts/Anticoagulants/Anti thrombotics: Aspirin 81 mg only  GI Procedures: None She denies family history of GI malignancy, inflammatory bowel disease, celiac disease  Past Medical History:  Diagnosis Date  . Anxiety   . Atrial fibrillation (HCC)   . Hypertension     Past Surgical History:  Procedure Laterality Date  . TUBAL LIGATION      Current Outpatient Medications:   .  albuterol (VENTOLIN HFA) 108 (90 Base) MCG/ACT inhaler, Inhale into the lungs., Disp: , Rfl:  .  Cholecalciferol (VITAMIN D3) 50 MCG (2000 UT) capsule, Take by mouth., Disp: , Rfl:  .  citalopram (CELEXA) 40 MG tablet, Take 40 mg by mouth., Disp: , Rfl:  .  fluticasone (FLONASE) 50 MCG/ACT nasal spray, 2 sprays by Each Nare route daily. for 7 days, Disp: , Rfl:  .  hydrOXYzine (ATARAX/VISTARIL) 50 MG tablet, Take by mouth., Disp: , Rfl:  .  ibuprofen (ADVIL) 600 MG tablet, Take by mouth., Disp: , Rfl:  .  Multiple Vitamin (MULTI-VITAMINS) TABS, Take by mouth., Disp: , Rfl:  .  sertraline (ZOLOFT) 50 MG tablet, Take by mouth., Disp: , Rfl:  .  amiodarone (PACERONE) 400 MG tablet, 1 tab by mouth daily. (Patient not taking: Reported on 02/07/2019), Disp: 30 tablet, Rfl: 0 .  aspirin EC 81 MG tablet, Take 81 mg by mouth., Disp: , Rfl:  .  diazepam (VALIUM) 10 MG tablet, Take 5 mg by mouth every 6 (six) hours as needed for anxiety., Disp: , Rfl:  .  dicyclomine (BENTYL) 10 MG capsule, Take 1 capsule (10 mg total) by mouth 4 (four) times daily -  before meals and at bedtime., Disp: 90 capsule, Rfl: 0 .  furosemide (LASIX) 20 MG tablet, Take 20 mg by mouth., Disp: , Rfl:  .  LORazepam (ATIVAN) 1 MG tablet, Take by mouth., Disp: , Rfl:  .  metoprolol  succinate (TOPROL XL) 50 MG 24 hr tablet, Take 1 tablet (50 mg total) by mouth daily. Take with or immediately following a meal., Disp: 30 tablet, Rfl: 0 .  metoprolol tartrate (LOPRESSOR) 50 MG tablet, Take 50 mg by mouth., Disp: , Rfl:  .  traMADol (ULTRAM) 50 MG tablet, Take by mouth., Disp: , Rfl:  .  zolpidem (AMBIEN) 5 MG tablet, Take 1 tablet (5 mg total) by mouth at bedtime as needed for sleep. (Patient not taking: Reported on 02/07/2019), Disp: 10 tablet, Rfl: 0    Family History  Problem Relation Age of Onset  . Diabetes Mother   . Diabetes Maternal Grandmother   . Heart attack Paternal Grandfather   . Breast cancer Paternal Aunt 32        2 pat aunts     Social History   Tobacco Use  . Smoking status: Current Every Day Smoker    Packs/day: 1.50  . Smokeless tobacco: Never Used  Substance Use Topics  . Alcohol use: Yes    Alcohol/week: 5.0 - 7.0 standard drinks    Types: 5 - 7 Cans of beer per week  . Drug use: No    Allergies as of 02/07/2019  . (No Known Allergies)    Review of Systems:    All systems reviewed and negative except where noted in HPI.   Physical Exam:  BP 122/85 (BP Location: Left Arm, Patient Position: Sitting, Cuff Size: Normal)   Pulse 77   Temp (!) 97.3 F (36.3 C)   Resp 17   Wt 122 lb 9.6 oz (55.6 kg)   LMP 09/23/2009 (Approximate)   BMI 17.59 kg/m  Patient's last menstrual period was 09/23/2009 (approximate).  General:   Alert, thin built, pleasant and cooperative in NAD Head:  Normocephalic and atraumatic. Eyes:  Sclera clear, no icterus.   Conjunctiva pink. Ears:  Normal auditory acuity. Nose:  No deformity, discharge, or lesions. Mouth:  No deformity or lesions,oropharynx pink & moist. Neck:  Supple; no masses or thyromegaly. Lungs:  Respirations even and unlabored.  Clear throughout to auscultation.   No wheezes, crackles, or rhonchi. No acute distress. Heart:  Regular rate and rhythm; no murmurs, clicks, rubs, or gallops. Abdomen:  Normal bowel sounds. Soft, non-tender and mildly distended, tympanic without masses, hepatosplenomegaly or hernias noted.  No guarding or rebound tenderness.   Rectal: Not performed Msk:  Symmetrical without gross deformities. Good, equal movement & strength bilaterally. Pulses:  Normal pulses noted. Extremities:  No clubbing or edema.  No cyanosis. Neurologic:  Alert and oriented x3;  grossly normal neurologically. Skin:  Intact without significant lesions or rashes. No jaundice. Psych:  Alert and cooperative. Normal mood and affect.  Imaging Studies: None  Assessment and Plan:   Ariel Hensley is a 54 y.o. female with chronic tobacco  use, history of anxiety, panic attacks is seen in consultation for chronic nonbloody diarrhea, predominantly postprandial associated with abdominal bloating, low BMI, macrocytosis with no anemia  Recommend EGD and colonoscopy with TI evaluation, biopsies, will use pediatric colonoscope Check pancreatic fecal elastase levels Trial of Bentyl 10 mg before each meal and at bedtime   Follow up in 4 to 6 weeks   Cephas Darby, MD

## 2019-02-15 ENCOUNTER — Other Ambulatory Visit
Admission: RE | Admit: 2019-02-15 | Discharge: 2019-02-15 | Disposition: A | Discharge status: Home / Self Care | Payer: PRIVATE HEALTH INSURANCE | Source: Ambulatory Visit | Attending: Gastroenterology | Admitting: Gastroenterology

## 2019-02-15 ENCOUNTER — Other Ambulatory Visit: Payer: Self-pay

## 2019-02-15 DIAGNOSIS — Z20822 Contact with and (suspected) exposure to covid-19: Secondary | ICD-10-CM | POA: Diagnosis not present

## 2019-02-15 DIAGNOSIS — Z01812 Encounter for preprocedural laboratory examination: Secondary | ICD-10-CM | POA: Insufficient documentation

## 2019-02-15 LAB — SARS CORONAVIRUS 2 (TAT 6-24 HRS): SARS Coronavirus 2: NEGATIVE

## 2019-02-16 ENCOUNTER — Other Ambulatory Visit: Payer: Self-pay | Admitting: Gastroenterology

## 2019-02-19 ENCOUNTER — Ambulatory Visit: Payer: PRIVATE HEALTH INSURANCE | Admitting: Anesthesiology

## 2019-02-19 ENCOUNTER — Encounter: Payer: Self-pay | Admitting: Gastroenterology

## 2019-02-19 ENCOUNTER — Ambulatory Visit
Admission: RE | Admit: 2019-02-19 | Discharge: 2019-02-19 | Disposition: A | Discharge status: Home / Self Care | Payer: PRIVATE HEALTH INSURANCE | Attending: Gastroenterology | Admitting: Gastroenterology

## 2019-02-19 ENCOUNTER — Encounter
Admission: RE | Disposition: A | Discharge status: Home / Self Care | Payer: Self-pay | Source: Home / Self Care | Attending: Gastroenterology

## 2019-02-19 DIAGNOSIS — K319 Disease of stomach and duodenum, unspecified: Secondary | ICD-10-CM | POA: Diagnosis not present

## 2019-02-19 DIAGNOSIS — I10 Essential (primary) hypertension: Secondary | ICD-10-CM | POA: Diagnosis not present

## 2019-02-19 DIAGNOSIS — Q438 Other specified congenital malformations of intestine: Secondary | ICD-10-CM | POA: Insufficient documentation

## 2019-02-19 DIAGNOSIS — K529 Noninfective gastroenteritis and colitis, unspecified: Secondary | ICD-10-CM

## 2019-02-19 DIAGNOSIS — Z79899 Other long term (current) drug therapy: Secondary | ICD-10-CM | POA: Diagnosis not present

## 2019-02-19 DIAGNOSIS — R1013 Epigastric pain: Secondary | ICD-10-CM | POA: Insufficient documentation

## 2019-02-19 DIAGNOSIS — R140 Abdominal distension (gaseous): Secondary | ICD-10-CM | POA: Diagnosis not present

## 2019-02-19 DIAGNOSIS — K633 Ulcer of intestine: Secondary | ICD-10-CM | POA: Insufficient documentation

## 2019-02-19 DIAGNOSIS — R1031 Right lower quadrant pain: Secondary | ICD-10-CM | POA: Diagnosis not present

## 2019-02-19 DIAGNOSIS — Z7982 Long term (current) use of aspirin: Secondary | ICD-10-CM | POA: Insufficient documentation

## 2019-02-19 DIAGNOSIS — R197 Diarrhea, unspecified: Secondary | ICD-10-CM

## 2019-02-19 DIAGNOSIS — F419 Anxiety disorder, unspecified: Secondary | ICD-10-CM | POA: Diagnosis not present

## 2019-02-19 DIAGNOSIS — F17210 Nicotine dependence, cigarettes, uncomplicated: Secondary | ICD-10-CM | POA: Insufficient documentation

## 2019-02-19 DIAGNOSIS — K644 Residual hemorrhoidal skin tags: Secondary | ICD-10-CM | POA: Diagnosis not present

## 2019-02-19 DIAGNOSIS — I4891 Unspecified atrial fibrillation: Secondary | ICD-10-CM | POA: Diagnosis not present

## 2019-02-19 HISTORY — PX: COLONOSCOPY WITH PROPOFOL: SHX5780

## 2019-02-19 HISTORY — PX: ESOPHAGOGASTRODUODENOSCOPY (EGD) WITH PROPOFOL: SHX5813

## 2019-02-19 LAB — HCG, QUANTITATIVE, PREGNANCY: hCG, Beta Chain, Quant, S: 9 m[IU]/mL — ABNORMAL HIGH (ref <5)

## 2019-02-19 LAB — POCT PREGNANCY, URINE: Preg Test, Ur: NEGATIVE

## 2019-02-19 SURGERY — ESOPHAGOGASTRODUODENOSCOPY (EGD) WITH PROPOFOL
Anesthesia: General

## 2019-02-19 MED ORDER — PROPOFOL 500 MG/50ML IV EMUL
INTRAVENOUS | Status: DC | PRN
  Administered 2019-02-19: 150 ug/kg/min via INTRAVENOUS

## 2019-02-19 MED ORDER — SODIUM CHLORIDE 0.9 % IV SOLN: INTRAVENOUS | Status: DC

## 2019-02-19 MED ORDER — LIDOCAINE HCL (PF) 2 % IJ SOLN
INTRAMUSCULAR | Status: DC | PRN
  Administered 2019-02-19: 100 mg via INTRADERMAL

## 2019-02-19 MED ORDER — PROPOFOL 10 MG/ML IV BOLUS
INTRAVENOUS | Status: DC | PRN
  Administered 2019-02-19 (×2): 20 mg via INTRAVENOUS
  Administered 2019-02-19: 60 mg via INTRAVENOUS
  Administered 2019-02-19 (×2): 20 mg via INTRAVENOUS
  Administered 2019-02-19: 40 mg via INTRAVENOUS

## 2019-02-19 NOTE — Anesthesia Preprocedure Evaluation (Addendum)
Anesthesia Evaluation  Patient identified by MRN, date of birth, ID band Patient awake    Reviewed: Allergy & Precautions, NPO status , Patient's Chart, lab work & pertinent test results  History of Anesthesia Complications (+) DIFFICULT AIRWAYNegative for: history of anesthetic complications  Airway Mallampati: II  TM Distance: >3 FB Neck ROM: Full    Dental  (+) Poor Dentition   Pulmonary neg sleep apnea, neg COPD, Current Smoker and Patient abstained from smoking.,    breath sounds clear to auscultation- rhonchi (-) wheezing      Cardiovascular hypertension, Pt. on medications (-) CAD, (-) Past MI, (-) Cardiac Stents and (-) CABG + dysrhythmias Atrial Fibrillation  Rhythm:Regular Rate:Normal - Systolic murmurs and - Diastolic murmurs    Neuro/Psych neg Seizures Anxiety negative neurological ROS     GI/Hepatic negative GI ROS, Neg liver ROS,   Endo/Other  negative endocrine ROSneg diabetes  Renal/GU negative Renal ROS     Musculoskeletal negative musculoskeletal ROS (+)   Abdominal (+) - obese,   Peds  Hematology negative hematology ROS (+)   Anesthesia Other Findings Past Medical History: No date: Anxiety No date: Atrial fibrillation (HCC) 08/22/2014: Atrial fibrillation with rapid ventricular response (HCC) 08/22/2014: Atrial fibrillation with rapid ventricular response (HCC) No date: Hypertension 05/11/2012: Pneumonia, community acquired 05/13/2012: Shortness of breath   Reproductive/Obstetrics                            Anesthesia Physical Anesthesia Plan  ASA: III  Anesthesia Plan: General   Post-op Pain Management:    Induction: Intravenous  PONV Risk Score and Plan: 1 and Propofol infusion  Airway Management Planned: Natural Airway  Additional Equipment:   Intra-op Plan:   Post-operative Plan:   Informed Consent: I have reviewed the patients History and Physical,  chart, labs and discussed the procedure including the risks, benefits and alternatives for the proposed anesthesia with the patient or authorized representative who has indicated his/her understanding and acceptance.     Dental advisory given  Plan Discussed with: CRNA and Anesthesiologist  Anesthesia Plan Comments:        Anesthesia Quick Evaluation

## 2019-02-19 NOTE — H&P (Signed)
Arlyss Repress, MD [address]  [address]  [place], [place] [address]  Main: [phone]  Fax: [phone] Pager: [phone]  Primary Care Physician:  Shane Crutch, [place] Primary Gastroenterologist:  Dr. Arlyss Repress  Pre-Procedure History & Physical: HPI:  Ariel Hensley is a 54 y.o. female is here for an endoscopy and colonoscopy.   Past Medical History:  Diagnosis Date  . Anxiety   . Atrial fibrillation (HCC)   . Atrial fibrillation with rapid ventricular response (HCC) 08/22/2014  . Atrial fibrillation with rapid ventricular response (HCC) 08/22/2014  . Hypertension   . Pneumonia, community acquired 05/11/2012  . Shortness of breath 05/13/2012    Past Surgical History:  Procedure Laterality Date  . TUBAL LIGATION      Prior to Admission medications   Medication Sig Start Date End Date Taking? Authorizing Provider  aspirin EC 81 MG tablet Take 81 mg by mouth. 08/24/14  Yes [provider]  Cholecalciferol (VITAMIN D3) 50 MCG (2000 UT) capsule Take by mouth.   Yes [provider]  sertraline (ZOLOFT) 50 MG tablet Take by mouth.   Yes [provider]  albuterol (VENTOLIN HFA) 108 (90 Base) MCG/ACT inhaler Inhale into the lungs. 05/13/12   [provider]  amiodarone (PACERONE) 400 MG tablet 1 tab by mouth daily. Patient not taking: Reported on 02/07/2019 02/25/16   Rockne Menghini, MD  citalopram (CELEXA) 40 MG tablet Take 40 mg by mouth.    [provider]  diazepam (VALIUM) 10 MG tablet Take 5 mg by mouth every 6 (six) hours as needed for anxiety.    [provider]  dicyclomine (BENTYL) 10 MG capsule Take 1 capsule (10 mg total) by mouth 4 (four) times daily -  before meals and at bedtime. 02/07/19   [name], MD  fluticasone (FLONASE) 50 MCG/ACT nasal spray 2 sprays by Each Nare route daily. for 7 days 11/11/17   [provider]  furosemide (LASIX) 20 MG tablet Take 20 mg by mouth. 09/07/14    [provider]  hydrOXYzine (ATARAX/VISTARIL) 50 MG tablet Take by mouth.    [provider]  ibuprofen (ADVIL) 600 MG tablet Take by mouth. 03/10/17   [provider]  LORazepam (ATIVAN) 1 MG tablet Take by mouth. 09/09/13   [provider]  metoprolol succinate (TOPROL XL) 50 MG 24 hr tablet Take 1 tablet (50 mg total) by mouth daily. Take with or immediately following a meal. 02/25/16 02/24/17  Rockne Menghini, MD  metoprolol tartrate (LOPRESSOR) 50 MG tablet Take 50 mg by mouth.    [provider]  Multiple Vitamin (MULTI-VITAMINS) TABS Take by mouth.    [provider]  traMADol Janean Sark) 50 MG tablet Take by mouth. 09/09/13   [provider]  zolpidem (AMBIEN) 5 MG tablet Take 1 tablet (5 mg total) by mouth at bedtime as needed for sleep. Patient not taking: Reported on 02/07/2019 08/24/14   Altamese Dilling, MD    Allergies as of 02/07/2019  . (No Known Allergies)    Family History  Problem Relation Age of Onset  . Diabetes Mother   . Diabetes Maternal Grandmother   . Heart attack Paternal Grandfather   . Breast cancer Paternal Aunt 30       2 pat aunts    Social History   Socioeconomic History  . Marital status: Married    Spouse name: Not on file  . Number of children: Not on file  .  Years of education: Not on file  . Highest education level: Not on file  Occupational History  . Not on file  Tobacco Use  . Smoking status: Current Every Day Smoker    Packs/day: 1.50  . Smokeless tobacco: Never Used  Substance and Sexual Activity  . Alcohol use: Yes    Alcohol/week: 5.0 - 7.0 standard drinks    Types: 5 - 7 Cans of beer per week  . Drug use: No  . Sexual activity: Not on file  Other Topics Concern  . Not on file  Social History Narrative  . Not on file   Social Determinants of Health   Financial Resource Strain:   . Difficulty of Paying Living Expenses: Not on file  Food Insecurity:   .  Worried About Charity fundraiser in the Last Year: Not on file  . Ran Out of Food in the Last Year: Not on file  Transportation Needs:   . Lack of Transportation (Medical): Not on file  . Lack of Transportation (Non-Medical): Not on file  Physical Activity:   . Days of Exercise per Week: Not on file  . Minutes of Exercise per Session: Not on file  Stress:   . Feeling of Stress : Not on file  Social Connections:   . Frequency of Communication with Friends and Family: Not on file  . Frequency of Social Gatherings with Friends and Family: Not on file  . Attends Religious Services: Not on file  . Active Member of Clubs or Organizations: Not on file  . Attends Archivist Meetings: Not on file  . Marital Status: Not on file  Intimate Partner Violence:   . Fear of Current or Ex-Partner: Not on file  . Emotionally Abused: Not on file  . Physically Abused: Not on file  . Sexually Abused: Not on file    Review of Systems: See HPI, otherwise negative ROS  Physical Exam: BP 113/78   Pulse 89   Temp (!) 97.4 F (36.3 C)   Resp 17   Ht 5\' 10"  (1.778 m)   Wt 56.7 kg   LMP 09/23/2009 (Approximate)   SpO2 98%   BMI 17.94 kg/m  General:   Alert,  pleasant and cooperative in NAD Head:  Normocephalic and atraumatic. Neck:  Supple; no masses or thyromegaly. Lungs:  Clear throughout to auscultation.    Heart:  Regular rate and rhythm. Abdomen:  Soft, nontender and nondistended. Normal bowel sounds, without guarding, and without rebound.   Neurologic:  Alert and  oriented x4;  grossly normal neurologically.  Impression/Plan: Ariel Hensley is here for an endoscopy and colonoscopy to be performed for chronic nonbloody diarrhea, predominantly postprandial associated with abdominal bloating, low BMI, macrocytosis with no anemia  Risks, benefits, limitations, and alternatives regarding  endoscopy and colonoscopy have been reviewed with the patient.  Questions have been answered.   All parties agreeable.   Sherri Sear, MD  02/19/2019, 10:45 AM

## 2019-02-19 NOTE — Op Note (Signed)
[facility] Patient Name: Ariel Hensley Procedure Date: 02/19/2019 10:32 AM MRN: [id number] Account #: [id number] Date of Birth: Nov 17, 1965 Admit Type: Outpatient Age: 54 Room: [facility] ENDO ROOM 1 Gender: Female Note Status: Finalized Procedure:             Upper GI endoscopy Indications:           Epigastric abdominal pain, Abdominal bloating, Diarrhea Providers:             Lin Landsman MD, MD Medicines:             Monitored Anesthesia Care Complications:         No immediate complications. Estimated blood loss: None. Procedure:             Pre-Anesthesia Assessment:                        - Prior to the procedure, a History and Physical was                         performed, and patient medications and allergies were                         reviewed. The patient is competent. The risks and                         benefits of the procedure and the sedation options and                         risks were discussed with the patient. All questions                         were answered and informed consent was obtained.                         Patient identification and proposed procedure were                         verified by the physician, the nurse, the                         anesthesiologist, the anesthetist and the technician                         in the pre-procedure area in the procedure room in the                         endoscopy suite. Mental Status Examination: alert and                         oriented. Airway Examination: normal oropharyngeal                         airway and neck mobility. Respiratory Examination:                         clear to auscultation. CV Examination: normal.                         Prophylactic Antibiotics: The  patient does not require                         prophylactic antibiotics. Prior Anticoagulants: The                         patient has taken no previous anticoagulant or   antiplatelet agents. ASA Grade Assessment: III - A                         patient with severe systemic disease. After reviewing                         the risks and benefits, the patient was deemed in                         satisfactory condition to undergo the procedure. The                         anesthesia plan was to use monitored anesthesia care                         (MAC). Immediately prior to administration of                         medications, the patient was re-assessed for adequacy                         to receive sedatives. The heart rate, respiratory                         rate, oxygen saturations, blood pressure, adequacy of                         pulmonary ventilation, and response to care were                         monitored throughout the procedure. The physical                         status of the patient was re-assessed after the                         procedure.                        After obtaining informed consent, the endoscope was                         passed under direct vision. Throughout the procedure,                         the patient's blood pressure, pulse, and oxygen                         saturations were monitored continuously. The Endoscope                         was introduced through the mouth, and advanced to the  second part of duodenum. The upper GI endoscopy was                         accomplished without difficulty. The patient tolerated                         the procedure well. Findings:      The esophagus, gastroesophageal junction and examined esophagus were       normal.      The stomach was normal. Biopsies were taken with a cold forceps for       Helicobacter pylori testing.      The examined duodenum was normal. Biopsies for histology were taken with       a cold forceps for evaluation of celiac disease.      The cardia and gastric fundus were normal on retroflexion. Impression:            -  Normal esophagus and gastroesophageal junction.                        - Normal stomach. Biopsied.                        - Normal examined duodenum. Biopsied. Recommendation:        - Await pathology results.                        - No ibuprofen, naproxen, or other non-steroidal                         anti-inflammatory drugs.                        - Proceed with colonoscopy as scheduled                        See colonoscopy report Procedure Code(s):     --- Professional ---                        [phone], Esophagogastroduodenoscopy, flexible,                         transoral; with biopsy, single or multiple Diagnosis Code(s):     --- Professional ---                        R10.13, Epigastric pain                        R14.0, Abdominal distension (gaseous)                        R19.7, Diarrhea, unspecified CPT copyright 2019 American Medical Association. All rights reserved. The codes documented in this report are preliminary and upon coder review may  be revised to meet current compliance requirements. Dr. Ulyess Mort Lin Landsman MD, MD 02/19/2019 11:07:10 AM This report has been signed electronically. Number of Addenda: 0 Note Initiated On: 02/19/2019 10:32 AM Estimated Blood Loss:  Estimated blood loss: none.      [facility]

## 2019-02-19 NOTE — Anesthesia Procedure Notes (Signed)
Date/Time: 02/19/2019 10:44 AM Performed by: Junious Silk, CRNA Pre-anesthesia Checklist: Patient identified, Emergency Drugs available, Suction available, Patient being monitored and Timeout performed Oxygen Delivery Method: Nasal cannula

## 2019-02-19 NOTE — Progress Notes (Signed)
Patient's UA pregnancy results were marked as NEG but they were inconclusive. After 3 tests having a faint line, a HCG serum was ordered for clearer results.

## 2019-02-19 NOTE — Transfer of Care (Signed)
Immediate Anesthesia Transfer of Care Note  Patient: Ariel Hensley  Procedure(s) Performed: ESOPHAGOGASTRODUODENOSCOPY (EGD) WITH PROPOFOL (N/A ) COLONOSCOPY WITH PROPOFOL (N/A )  Patient Location: PACU  Anesthesia Type:General  Level of Consciousness: awake, alert  and oriented  Airway & Oxygen Therapy: Patient Spontanous Breathing and Patient connected to nasal cannula oxygen  Post-op Assessment: Report given to RN and Post -op Vital signs reviewed and stable  Post vital signs: Reviewed and stable  Last Vitals:  Vitals Value Taken Time  BP    Temp 36.9 C 02/19/19 1131  Pulse 89 02/19/19 1134  Resp 13 02/19/19 1134  SpO2 100 % 02/19/19 1134  Vitals shown include unvalidated device data.  Last Pain:  Vitals:   02/19/19 1131  TempSrc: Temporal  PainSc:          Complications: No apparent anesthesia complications

## 2019-02-19 NOTE — Anesthesia Postprocedure Evaluation (Signed)
Anesthesia Post Note  Patient: Vivienne Sangiovanni Pinette  Procedure(s) Performed: ESOPHAGOGASTRODUODENOSCOPY (EGD) WITH PROPOFOL (N/A ) COLONOSCOPY WITH PROPOFOL (N/A )  Patient location during evaluation: Endoscopy Anesthesia Type: General Level of consciousness: awake and alert and oriented Pain management: pain level controlled Vital Signs Assessment: post-procedure vital signs reviewed and stable Respiratory status: spontaneous breathing, nonlabored ventilation and respiratory function stable Cardiovascular status: blood pressure returned to baseline and stable Postop Assessment: no signs of nausea or vomiting Anesthetic complications: no     Last Vitals:  Vitals:   02/19/19 1151 02/19/19 1201  BP: (!) 131/91 132/79  Pulse:    Resp:    Temp:    SpO2:      Last Pain:  Vitals:   02/19/19 1201  TempSrc:   PainSc: 0-No pain                 Iann Rodier

## 2019-02-19 NOTE — Op Note (Signed)
[facility] Patient Name: Ariel Hensley Procedure Date: 02/19/2019 10:32 AM MRN: [id number] Account #: [id number] Date of Birth: [birth year] Admit Type: Outpatient Age: 54 Room: [facility] ENDO ROOM 1 Gender: Female Note Status: Finalized Procedure:             Colonoscopy Indications:           Abdominal pain in the right lower quadrant, , Chronic                         diarrhea, h/o BC [place] use Providers:             Lin Landsman MD, MD Medicines:             Monitored Anesthesia Care Complications:         No immediate complications. Estimated blood loss: None. Procedure:             Pre-Anesthesia Assessment:                        - Prior to the procedure, a History and Physical was                         performed, and patient medications and allergies were                         reviewed. The patient is competent. The risks and                         benefits of the procedure and the sedation options and                         risks were discussed with the patient. All questions                         were answered and informed consent was obtained.                         Patient identification and proposed procedure were                         verified by the physician, the nurse, the                         anesthesiologist, the anesthetist and the technician                         in the pre-procedure area in the procedure room in the                         endoscopy suite. Mental Status Examination: alert and                         oriented. Airway Examination: normal oropharyngeal                         airway and neck mobility. Respiratory Examination:                         clear to auscultation.  CV Examination: normal.                         Prophylactic Antibiotics: The patient does not require                         prophylactic antibiotics. Prior Anticoagulants: The                         patient has taken no  previous anticoagulant or                         antiplatelet agents. ASA Grade Assessment: III - A                         patient with severe systemic disease. After reviewing                         the risks and benefits, the patient was deemed in                         satisfactory condition to undergo the procedure. The                         anesthesia plan was to use monitored anesthesia care                         (MAC). Immediately prior to administration of                         medications, the patient was re-assessed for adequacy                         to receive sedatives. The heart rate, respiratory                         rate, oxygen saturations, blood pressure, adequacy of                         pulmonary ventilation, and response to care were                         monitored throughout the procedure. The physical                         status of the patient was re-assessed after the                         procedure.                        After obtaining informed consent, the colonoscope was                         passed under direct vision. Throughout the procedure,                         the patient's blood pressure, pulse, and oxygen  saturations were monitored continuously. The                         Colonoscope was introduced through the anus and                         advanced to the the terminal ileum, with                         identification of the appendiceal orifice and IC                         valve. The colonoscopy was performed with moderate                         difficulty due to a tortuous colon. Successful                         completion of the procedure was aided by applying                         abdominal pressure. The patient tolerated the                         procedure well. The quality of the bowel preparation                         was evaluated using the BBPS [facility] Bowel Preparation                          Scale) with scores of: Right Colon = 3, Transverse                         Colon = 3 and Left Colon = 3 (entire mucosa seen well                         with no residual staining, small fragments of stool or                         opaque liquid). The total BBPS score equals 9. Findings:      Skin tags were found on perianal exam.      The terminal ileum contained a few small superficial ulcers and aphthae.       No bleeding was present. No stigmata of recent bleeding were seen.       Biopsies were taken with a cold forceps for histology.      Normal mucosa was found in the left colon and in the right colon.       Biopsies were taken with a cold forceps for histology.      Non-bleeding external hemorrhoids were found during retroflexion. The       hemorrhoids were medium-sized. Impression:            - Perianal skin tags found on perianal exam.                        - A few ulcers in the terminal ileum. Biopsied.                        -  Normal mucosa in the left colon and in the right                         colon. Biopsied.                        - Non-bleeding external hemorrhoids. Recommendation:        - Discharge patient to home (with escort).                        - Resume previous diet today.                        - Continue present medications.                        - Await pathology results.                        - Return to my office as previously scheduled.                        - Avoid intake of BC powder and No ibuprofen,                         naproxen, or other non-steroidal anti-inflammatory                         drugs. Procedure Code(s):     --- Professional ---                        [phone], Colonoscopy, flexible; with biopsy, single or                         multiple Diagnosis Code(s):     --- Professional ---                        K63.3, Ulcer of intestine                        K64.4, Residual hemorrhoidal skin tags                         R10.31, Right lower quadrant pain                        K52.9, Noninfective gastroenteritis and colitis,                         unspecified CPT copyright 2019 American Medical Association. All rights reserved. The codes documented in this report are preliminary and upon coder review may  be revised to meet current compliance requirements. Dr. Ulyess Mort Lin Landsman MD, MD 02/19/2019 11:31:58 AM This report has been signed electronically. Number of Addenda: 0 Note Initiated On: 02/19/2019 10:32 AM Scope Withdrawal Time: 0 hours 13 minutes 9 seconds  Total Procedure Duration: 0 hours 18 minutes 28 seconds  Estimated Blood Loss:  Estimated blood loss: none.      [facility]

## 2019-02-20 ENCOUNTER — Encounter: Payer: Self-pay | Admitting: *Deleted

## 2019-02-20 LAB — SURGICAL PATHOLOGY

## 2019-02-28 ENCOUNTER — Telehealth: Payer: Self-pay | Admitting: Gastroenterology

## 2019-02-28 LAB — PANCREATIC ELASTASE, FECAL: Pancreatic Elastase, Fecal: >500 ug Elast./g (ref >200)

## 2019-02-28 NOTE — Telephone Encounter (Signed)
Contacted pt and discussed lab results.

## 2019-02-28 NOTE — Telephone Encounter (Signed)
Patient called & received results in My Chart would like someone to go over them with her. Please advise.

## 2019-03-18 ENCOUNTER — Other Ambulatory Visit: Payer: Self-pay

## 2019-03-21 ENCOUNTER — Ambulatory Visit: Payer: PRIVATE HEALTH INSURANCE | Admitting: Gastroenterology

## 2019-04-08 ENCOUNTER — Other Ambulatory Visit: Payer: Self-pay

## 2019-04-08 MED ORDER — DICYCLOMINE HCL 10 MG PO CAPS: 10.0000 mg | ORAL_CAPSULE | Freq: Three times a day (TID) | ORAL | 0 refills | Status: DC

## 2019-04-08 NOTE — Telephone Encounter (Signed)
Last office visit 02/07/2019 Chronic diarrhea  Last refill 02/19/2019 0 refills

## 2019-04-18 ENCOUNTER — Encounter: Payer: Self-pay | Admitting: Gastroenterology

## 2019-04-18 ENCOUNTER — Other Ambulatory Visit: Payer: Self-pay | Admitting: Gastroenterology

## 2019-04-18 ENCOUNTER — Other Ambulatory Visit: Payer: Self-pay

## 2019-04-18 ENCOUNTER — Ambulatory Visit (INDEPENDENT_AMBULATORY_CARE_PROVIDER_SITE_OTHER): Payer: PRIVATE HEALTH INSURANCE | Admitting: Gastroenterology

## 2019-04-18 ENCOUNTER — Other Ambulatory Visit
Admission: RE | Admit: 2019-04-18 | Discharge: 2019-04-18 | Disposition: A | Discharge status: Home / Self Care | Payer: PRIVATE HEALTH INSURANCE | Attending: Gastroenterology | Admitting: Gastroenterology

## 2019-04-18 VITALS — BP 105/65 | HR 89 | Temp 98.7°F | Ht 70.0 in | Wt 114.2 lb

## 2019-04-18 DIAGNOSIS — E349 Endocrine disorder, unspecified: Secondary | ICD-10-CM | POA: Diagnosis present

## 2019-04-18 DIAGNOSIS — K529 Noninfective gastroenteritis and colitis, unspecified: Secondary | ICD-10-CM

## 2019-04-18 DIAGNOSIS — R140 Abdominal distension (gaseous): Secondary | ICD-10-CM

## 2019-04-18 DIAGNOSIS — D7589 Other specified diseases of blood and blood-forming organs: Secondary | ICD-10-CM

## 2019-04-18 DIAGNOSIS — R634 Abnormal weight loss: Secondary | ICD-10-CM

## 2019-04-18 DIAGNOSIS — R7989 Other specified abnormal findings of blood chemistry: Secondary | ICD-10-CM

## 2019-04-18 NOTE — Progress Notes (Signed)
Arlyss Repress, MD [address]  [address]  [place], [place] [address]  Main: [phone]  Fax: [phone]    Gastroenterology Consultation  Referring Provider:     Shane Crutch, [place] Primary Care Physician:  Shane Crutch, [place] Primary Gastroenterologist:  Dr. Arlyss Repress Reason for Consultation: Chronic diarrhea, abdominal bloating and weight loss        HPI:   Ariel Hensley is a 54 y.o. female referred by Dr. Shane Crutch, PA  for consultation & management of unexplained diarrhea.  Patient reports that for at least 2 years, she has been experiencing severe postprandial diarrhea associated with abdominal cramps and bloating daily.  Cannot identify any particular food triggers.  She denies drinking coffee, carbonated beverages, does not eat chocolate regularly.  Her BMI is only 17.  She is not able to gain weight.  She denies rectal bleeding.  She does report epigastric burning pain, heartburn and takes Pepcid as needed.  She did not have abdominal surgeries.  She does smoke 1 pack/day for more than 20 years.  She does not drink alcohol.  Labs at [facility] from 10/2018 revealed normal CMP, TSH.  Hemoglobin was 16 in 09/2018, MCV 105  She does have history of anxiety, nervousness, panic attacks for which she is taking hydroxyzine, Celexa, Zoloft.  She was diagnosed with A. fib few years ago, was evaluated by cardiology, recommended to take aspirin 81 mg only due to low chads 2.  She lost insurance and did not follow-up with cardiology since then.  She says she now has insurance and has to see the cardiology.  Follow-up visit 04/18/19 Ariel Hensley reports her symptoms are the same.  She continues to have abdominal bloating, postprandial urgency, nonbloody diarrhea.  She is taking Bentyl 3 times a day before meals and at bedtime which is not helping. She is not using BC powder anymore.  She underwent EGD and colonoscopy which were unremarkable.  She has serum beta-hCG positive before  she underwent procedures.  She had history of tubal ligation.  She did lose 8 pounds since last visit unintentionally.  She reports her appetite is intact. Pancreatic fecal elastase came back unremarkable  NSAIDs: None  Antiplts/Anticoagulants/Anti thrombotics: Aspirin 81 mg only  GI Procedures:  EGD and colonoscopy 2-21  - Normal esophagus and gastroesophageal junction. - Normal stomach. Biopsied. - Normal examined duodenum. Biopsied.  - Perianal skin tags found on perianal exam. - A few ulcers in the terminal ileum. Biopsied. - Normal mucosa in the left colon and in the right colon. Biopsied. - Non-bleeding external hemorrhoids.  DIAGNOSIS:  A. DUODENUM; COLD BIOPSY:  - UNREMARKABLE DUODENAL MUCOSA WITH PRESERVED VILLOUS ARCHITECTURE.  - NEGATIVE FOR FEATURES OF CELIAC, DYSPLASIA, AND MALIGNANCY.   B. STOMACH, RANDOM; COLD BIOPSY:  - ANTRAL MUCOSA WITH MILD REACTIVE GASTROPATHY, NON-SPECIFIC.  - UNREMARKABLE OXYNTIC MUCOSA.  - NEGATIVE FOR H. PYLORI, DYSPLASIA, AND MALIGNANCY.   C. TERMINAL ILEUM; COLD BIOPSY:  - SMALL INTESTINAL MUCOSA WITH FOCAL MILD ACTIVE ILEITIS, NON-SPECIFIC.  - NEGATIVE FOR ARCHITECTURAL FEATURES OF CHRONICITY, GRANULOMAS,  DYSPLASIA, AND MALIGNANCY.   D. COLON, RANDOM RIGHT; COLD BIOPSY:  - COLONIC MUCOSA WITH FOCAL SMALL LYMPHOID AGGREGATES, NON-SPECIFIC.  - NEGATIVE FOR MICROSCOPIC COLITIS, DYSPLASIA, AND MALIGNANCY.   E. COLON, RANDOM LEFT; COLD BIOPSY:  - UNREMARKABLE COLONIC MUCOSA.  - NEGATIVE FOR MICROSCOPIC COLITIS, DYSPLASIA, AND MALIGNANCY.  She denies family history of GI malignancy, inflammatory bowel disease, celiac disease  Past Medical History:  Diagnosis  Date  . Anxiety   . Atrial fibrillation ([place])   . Atrial fibrillation with rapid ventricular response ([place]) 08/22/2014  . Atrial fibrillation with rapid ventricular response ([place]) 08/22/2014  . Hypertension   . Pneumonia, community acquired 05/11/2012  . Shortness of breath  05/13/2012    Past Surgical History:  Procedure Laterality Date  . COLONOSCOPY WITH PROPOFOL N/A 02/19/2019   Procedure: COLONOSCOPY WITH PROPOFOL;  Surgeon: Lin Landsman, MD;  Location: [facility] ENDOSCOPY;  Service: Gastroenterology;  Laterality: N/A;  . ESOPHAGOGASTRODUODENOSCOPY (EGD) WITH PROPOFOL N/A 02/19/2019   Procedure: ESOPHAGOGASTRODUODENOSCOPY (EGD) WITH PROPOFOL;  Surgeon: Lin Landsman, MD;  Location: [facility] ENDOSCOPY;  Service: Gastroenterology;  Laterality: N/A;  . TUBAL LIGATION      Current Outpatient Medications:  .  albuterol (VENTOLIN HFA) 108 (90 Base) MCG/ACT inhaler, Inhale into the lungs., Disp: , Rfl:  .  Cholecalciferol (VITAMIN D3) 50 MCG (2000 UT) capsule, Take by mouth., Disp: , Rfl:  .  dicyclomine (BENTYL) 10 MG capsule, Take 1 capsule (10 mg total) by mouth 4 (four) times daily -  before meals and at bedtime., Disp: 90 capsule, Rfl: 0 .  Multiple Vitamin (MULTI-VITAMINS) TABS, Take by mouth., Disp: , Rfl:  .  sertraline (ZOLOFT) 100 MG tablet, Take 100 mg by mouth daily., Disp: , Rfl:  .  traZODone (DESYREL) 50 MG tablet, TAKE 1 TABLET BY MOUTH NIGHTLY AT BEDTIME, Disp: , Rfl:     Family History  Problem Relation Age of Onset  . Diabetes Mother   . Diabetes Maternal Grandmother   . Heart attack Paternal Grandfather   . Breast cancer Paternal Aunt 61       2 pat aunts     Social History   Tobacco Use  . Smoking status: Current Every Day Smoker    Packs/day: 1.50  . Smokeless tobacco: Never Used  Substance Use Topics  . Alcohol use: Yes    Alcohol/week: 5.0 - 7.0 standard drinks    Types: 5 - 7 Cans of beer per week  . Drug use: No    Allergies as of 04/18/2019  . (No Known Allergies)    Review of Systems:    All systems reviewed and negative except where noted in HPI.   Physical Exam:  BP 105/65   Pulse 89   Temp 98.7 F (37.1 C) (Temporal)   Ht 5\' 10"  (1.778 m)   Wt 114 lb 3.2 oz (51.8 kg)   LMP 09/23/2009 (Approximate)    BMI 16.39 kg/m  Patient's last menstrual period was 09/23/2009 (approximate).  General:   Alert, thin built, pleasant and cooperative in NAD Head:  Normocephalic and atraumatic. Eyes:  Sclera clear, no icterus.   Conjunctiva pink. Ears:  Normal auditory acuity. Nose:  No deformity, discharge, or lesions. Mouth:  No deformity or lesions,oropharynx pink & moist. Neck:  Supple; no masses or thyromegaly. Lungs:  Respirations even and unlabored.  Clear throughout to auscultation.   No wheezes, crackles, or rhonchi. No acute distress. Heart:  Regular rate and rhythm; no murmurs, clicks, rubs, or gallops. Abdomen:  Normal bowel sounds. Soft, non-tender and mildly distended, tympanic without masses, hepatosplenomegaly or hernias noted.  No guarding or rebound tenderness.   Rectal: Not performed Msk:  Symmetrical without gross deformities. Good, equal movement & strength bilaterally. Pulses:  Normal pulses noted. Extremities:  No clubbing or edema.  No cyanosis. Neurologic:  Alert and oriented x3;  grossly normal neurologically. Skin:  Intact without significant lesions or rashes.  No jaundice. Psych:  Alert and cooperative. Normal mood and affect.  Imaging Studies: None  Assessment and Plan:   Ariel Hensley is a 54 y.o. female with chronic tobacco use, history of anxiety, panic attacks is seen in consultation for chronic nonbloody diarrhea, predominantly postprandial associated with abdominal bloating, low BMI, macrocytosis with no anemia  Chronic diarrhea, abdominal bloating and postprandial cramps, weight loss EGD and colonoscopy with TI evaluation, biopsies were unremarkable pancreatic fecal elastase levels were normal Trial of pancreatic enzymes  Positive serum beta-hCG Recheck beta-hCG levels Ultrasound pelvis Defer CT abdomen at this time until we rule out any pregnancy  Macrocytosis without anemia Check B12 and folate panel   Follow up in 4 to 6 weeks   Arlyss Repress,  MD

## 2019-04-21 ENCOUNTER — Other Ambulatory Visit: Payer: Self-pay | Admitting: Gastroenterology

## 2019-04-22 ENCOUNTER — Other Ambulatory Visit: Payer: Self-pay

## 2019-04-22 ENCOUNTER — Ambulatory Visit
Admission: RE | Admit: 2019-04-22 | Discharge: 2019-04-22 | Disposition: A | Discharge status: Home / Self Care | Payer: PRIVATE HEALTH INSURANCE | Source: Ambulatory Visit | Attending: Gastroenterology | Admitting: Gastroenterology

## 2019-04-22 DIAGNOSIS — R7989 Other specified abnormal findings of blood chemistry: Secondary | ICD-10-CM

## 2019-04-22 DIAGNOSIS — E349 Endocrine disorder, unspecified: Secondary | ICD-10-CM | POA: Diagnosis not present

## 2019-04-26 ENCOUNTER — Other Ambulatory Visit: Payer: Self-pay | Admitting: Family Medicine

## 2019-04-26 ENCOUNTER — Telehealth: Payer: Self-pay

## 2019-04-26 DIAGNOSIS — K529 Noninfective gastroenteritis and colitis, unspecified: Secondary | ICD-10-CM

## 2019-04-26 DIAGNOSIS — R221 Localized swelling, mass and lump, neck: Secondary | ICD-10-CM

## 2019-04-26 MED ORDER — DICYCLOMINE HCL 10 MG PO CAPS: 10.0000 mg | ORAL_CAPSULE | Freq: Three times a day (TID) | ORAL | 0 refills | Status: DC

## 2019-04-26 MED ORDER — PANCRELIPASE (LIP-PROT-AMYL) 36000-114000 UNITS PO CPEP: ORAL_CAPSULE | ORAL | 0 refills | Status: DC

## 2019-04-26 MED ORDER — PANCRELIPASE (LIP-PROT-AMYL) 36000-114000 UNITS PO CPEP: ORAL_CAPSULE | ORAL | 1 refills | Status: DC

## 2019-04-26 NOTE — Telephone Encounter (Signed)
Last office visit 04/18/2019 macrocytosis without anemia chronic diarrhea  Last refill Creon Gave samples on 04/18/2019  Dicyclomine 04/22/2019 0 refills  No appointment is scheduled  Tried to call patient to find out how the creon work for her. Left a message for call back

## 2019-04-26 NOTE — Telephone Encounter (Signed)
Patient was given samples of creon on 04/18/2019. Patient is taking 2 pills with every meal after the first bite and 1 pill with snacks. Patient states she feels a little bit better but is still having diarrhea every day 4-5 times a day. Patient would like a prescription for this medication if she could

## 2019-04-26 NOTE — Telephone Encounter (Signed)
Patient would like a prescription of Creon & a refill on dicyclonine.

## 2019-04-26 NOTE — Telephone Encounter (Signed)
I also faxed information to the Creon [facility] enrollment form.

## 2019-04-26 NOTE — Addendum Note (Signed)
Addended by: Radene Knee L on: 04/26/2019 10:32 AM   Modules accepted: Orders

## 2019-04-26 NOTE — Telephone Encounter (Signed)
Order stool samples per Dr. Allegra Lai orders. Informed patient she could go to any [facility] facility to have this done at. Informed patient that we also called in creon to her local pharmacy. Informed patient that if it was to expensive at local pharmacy or if insurance did not cover medication we could send it to the speciality pharmacy. Patient verbalized understanding

## 2019-04-26 NOTE — Telephone Encounter (Signed)
Recommend stool studies to r/o infection and check fecal calprotectin levels Ok to prescribe creon  RV

## 2019-04-26 NOTE — Telephone Encounter (Signed)
Patient called to inform us that she just got a text from her pharmacy and the Creon is 3600 dollars. Informed patient I would send the medication to the speciality pharmacy [facility] and informed her that they would contact her about pricing and if she was okay then they would mail her the medication. Patient verbalized understanding. Faxed information to them and sent through epic

## 2019-04-26 NOTE — Addendum Note (Signed)
Addended by: Radene Knee L on: 04/26/2019 10:10 AM   Modules accepted: Orders

## 2019-04-26 NOTE — Telephone Encounter (Signed)
activities as tolerated 

## 2019-04-29 ENCOUNTER — Telehealth: Payer: Self-pay

## 2019-04-29 DIAGNOSIS — R7989 Other specified abnormal findings of blood chemistry: Secondary | ICD-10-CM

## 2019-04-29 DIAGNOSIS — R197 Diarrhea, unspecified: Secondary | ICD-10-CM

## 2019-04-29 DIAGNOSIS — E349 Endocrine disorder, unspecified: Secondary | ICD-10-CM

## 2019-04-29 NOTE — Telephone Encounter (Signed)
-----   Message from [name], MD sent at 04/29/2019 11:27 AM EDT ----- Please inform patient that her pelvic ultrasound was normal. Because, she has elevated serum hCG levels, recommend referral to OB/GYN and recommend CT abdomen and pelvis with contrast  Rohini [name]

## 2019-04-29 NOTE — Telephone Encounter (Signed)
Patient verbalized understanding of lab results. Patient states she does not see a GYN office and does not care who she sees. Did referral and order CT scan. CT scan is scheduled for [facility] 05/03/2019 9:45am nothing to eat or drank 4 hours before. Pick up contrast before the scan. Patient verbalized understanding and will pick up the contrast

## 2019-05-01 ENCOUNTER — Other Ambulatory Visit: Payer: Self-pay

## 2019-05-01 ENCOUNTER — Ambulatory Visit
Admission: RE | Admit: 2019-05-01 | Discharge: 2019-05-01 | Disposition: A | Discharge status: Home / Self Care | Payer: PRIVATE HEALTH INSURANCE | Source: Ambulatory Visit | Attending: Family Medicine | Admitting: Family Medicine

## 2019-05-01 ENCOUNTER — Telehealth: Payer: Self-pay | Admitting: Gastroenterology

## 2019-05-01 DIAGNOSIS — R221 Localized swelling, mass and lump, neck: Secondary | ICD-10-CM | POA: Insufficient documentation

## 2019-05-01 NOTE — Telephone Encounter (Signed)
Patient states she is still having diarrhea every day. Patient can not get the creon at this time. Patient wants to know if she can take the Bentyl 10mg  more then 4 times a day

## 2019-05-01 NOTE — Telephone Encounter (Signed)
We need to rule out infectious diarrhea.  So, please tell her that she should drop off the stool specimen for the stool studies that we ordered last week  Thanks RV

## 2019-05-01 NOTE — Telephone Encounter (Signed)
Patient called & would like to speak with Morrie Sheldon DR [name]'s nurse.She has question about her medication dicyclomine 10mg  4 a day.

## 2019-05-02 NOTE — Telephone Encounter (Signed)
Patient verbalized understanding. She states she will be returning the samples tomorrow

## 2019-05-03 ENCOUNTER — Other Ambulatory Visit: Payer: Self-pay

## 2019-05-03 ENCOUNTER — Ambulatory Visit
Admission: RE | Admit: 2019-05-03 | Discharge: 2019-05-03 | Disposition: A | Discharge status: Home / Self Care | Payer: PRIVATE HEALTH INSURANCE | Source: Ambulatory Visit | Attending: Gastroenterology | Admitting: Gastroenterology

## 2019-05-03 DIAGNOSIS — E349 Endocrine disorder, unspecified: Secondary | ICD-10-CM | POA: Insufficient documentation

## 2019-05-03 DIAGNOSIS — R197 Diarrhea, unspecified: Secondary | ICD-10-CM | POA: Insufficient documentation

## 2019-05-03 DIAGNOSIS — R7989 Other specified abnormal findings of blood chemistry: Secondary | ICD-10-CM

## 2019-05-03 MED ORDER — IOHEXOL 300 MG/ML  SOLN
75.0000 mL | Freq: Once | INTRAMUSCULAR | Status: AC | PRN
  Administered 2019-05-03: 75 mL via INTRAVENOUS

## 2019-05-06 ENCOUNTER — Telehealth: Payer: Self-pay

## 2019-05-06 MED ORDER — VANCOMYCIN HCL 125 MG PO CAPS: 125.0000 mg | ORAL_CAPSULE | Freq: Four times a day (QID) | ORAL | 0 refills | Status: AC

## 2019-05-06 NOTE — Telephone Encounter (Signed)
Patient verbalized understanding of results. Patient states she will go pick up medication at pharmacy. Sent to pharmacy [facility]

## 2019-05-06 NOTE — Telephone Encounter (Signed)
-----   Message from [name], MD sent at 05/06/2019  3:02 PM EDT ----- She has C. difficile infection which explains diarrhea.  Recommend 2 weeks course of oral vancomycin 125 mg 4 times a day  Rohini [name]

## 2019-05-07 ENCOUNTER — Telehealth: Payer: Self-pay | Admitting: Gastroenterology

## 2019-05-07 MED ORDER — METRONIDAZOLE 500 MG PO TABS: 500.0000 mg | ORAL_TABLET | Freq: Three times a day (TID) | ORAL | 0 refills | Status: AC

## 2019-05-07 NOTE — Telephone Encounter (Signed)
Patient called and the antibiotic that was called in was very expensive $481. After insurance. Needs a cheaper antibiotic, please call [facility] asap.

## 2019-05-07 NOTE — Telephone Encounter (Signed)
Patient states she has taken the Metronidazole 500mg  in the past and does not remember being that expensive. Patient states she would like to use this medication. Sent medication to the pharmacy

## 2019-05-07 NOTE — Telephone Encounter (Signed)
Ariel Hensley  Original prescription was Vancomycin 125 mg orally 4 times daily for 14 days,  Here are our options in following order  Vancomycin 125mg  4 times a day for 10 days or Fidaxomicin 200 mg orally twice daily for 10 days or Metronidazole 500 mg orally 3 times daily for 10 days  Let me know  Ariel Hensley

## 2019-05-07 NOTE — Addendum Note (Signed)
Addended by: Radene Knee L on: 05/07/2019 01:13 PM   Modules accepted: Orders

## 2019-05-08 ENCOUNTER — Encounter: Payer: Self-pay | Admitting: Obstetrics and Gynecology

## 2019-05-08 LAB — GI PROFILE, STOOL, PCR

## 2019-05-08 LAB — CALPROTECTIN, FECAL: Calprotectin, Fecal: 23 ug/g (ref 0–120)

## 2019-05-21 ENCOUNTER — Telehealth: Payer: Self-pay | Admitting: Gastroenterology

## 2019-05-21 NOTE — Telephone Encounter (Signed)
Patient having severe gas and using OTC gas ex not helping. What can she do? Please call patient.

## 2019-05-21 NOTE — Telephone Encounter (Signed)
Called and left a message for call back  

## 2019-05-21 NOTE — Telephone Encounter (Signed)
Patient states she is not have diarrhea any more. She just has loose stools but is normal and goes twice a day. She takes the Bentyl twice a day. Patient states she finished the C diff treatment. Patient will pick up a probiotic otc

## 2019-05-21 NOTE — Telephone Encounter (Signed)
Please advised  

## 2019-05-21 NOTE — Telephone Encounter (Signed)
Please check with her if her diarrhea has resolved and if she finished treatment for C. difficile infection.  If so, she can try over-the-counter probiotics like align or Florastor or Culturelle.  If she is still having diarrhea, please recheck for C. difficile infection  RV

## 2019-05-27 ENCOUNTER — Ambulatory Visit
Admission: RE | Admit: 2019-05-27 | Discharge: 2019-05-27 | Disposition: A | Discharge status: Home / Self Care | Payer: PRIVATE HEALTH INSURANCE | Source: Ambulatory Visit | Attending: Family Medicine | Admitting: Family Medicine

## 2019-05-27 DIAGNOSIS — Z1231 Encounter for screening mammogram for malignant neoplasm of breast: Secondary | ICD-10-CM | POA: Insufficient documentation

## 2019-05-30 ENCOUNTER — Ambulatory Visit (INDEPENDENT_AMBULATORY_CARE_PROVIDER_SITE_OTHER): Payer: PRIVATE HEALTH INSURANCE | Admitting: Obstetrics and Gynecology

## 2019-05-30 ENCOUNTER — Other Ambulatory Visit: Payer: Self-pay

## 2019-05-30 ENCOUNTER — Encounter: Payer: Self-pay | Admitting: Obstetrics and Gynecology

## 2019-05-30 VITALS — BP 118/78 | HR 72 | Ht 68.0 in | Wt 119.0 lb

## 2019-05-30 DIAGNOSIS — R7989 Other specified abnormal findings of blood chemistry: Secondary | ICD-10-CM

## 2019-05-30 DIAGNOSIS — E349 Endocrine disorder, unspecified: Secondary | ICD-10-CM | POA: Diagnosis not present

## 2019-05-30 NOTE — Progress Notes (Signed)
Obstetrics & Gynecology Office Visit   Chief Complaint  Patient presents with  . Advice Only    Ariel Hensley GI referral increased HCG/positive urine   The patient is seen in referral at the request of Ariel Hensley from [facility] for elevated hCG and positive pregnancy test.   History of Present Illness: 54 y.o. [id number] female who is seen in referral from Ariel Hensley  from [facility] for elevated hCG and positive pregnancy test.  She states that in early February she was going to undergo endoscopy and had a two positive urine pregnancy tests and one negative. She had an hCG level of 9 that same day.  She went to her PCP at [facility]) and had some testing done.  She was told that she was in menopause.   She recently was diagnosed with C. Diff and treated.  She has been feeling tired and unwell.  She had some weight loss associated with c diff. She was also feeling irritable, exhausted, and moody.  She was told that she was in menopause by her PCP.  Her last menstrual period was 2011.  She has had no bleeding since that time.  Her most recent pap smear, which she states was abnormal.  She states that she has had multiple abnormal pap smears.   She has never had hot flashes.  She stopped having her period after her daughter died in a MVC.   She had a CT scan on 05/03/2019:  Essentially, in the reproductive area the scan was normal apart from periuterine veins.  No adnexal masses were noted.   She had a normal mammogram on 5/10 (BiRads 1)  Past Medical History:  Diagnosis Date  . Anxiety   . Atrial fibrillation ([facility])   . Atrial fibrillation with rapid ventricular response ([facility]) 08/22/2014  . Atrial fibrillation with rapid ventricular response ([facility]) 08/22/2014  . Hypertension   . Pneumonia, community acquired 05/11/2012  . Pneumonia, community acquired 05/11/2012  . Shortness of breath 05/13/2012  . Shortness of breath 05/13/2012    Past Surgical History:    Procedure Laterality Date  . COLONOSCOPY WITH PROPOFOL N/A 02/19/2019   Procedure: COLONOSCOPY WITH PROPOFOL;  Surgeon: Ariel Hensley;  Location: [facility] ENDOSCOPY;  Service: Gastroenterology;  Laterality: N/A;  . ESOPHAGOGASTRODUODENOSCOPY (EGD) WITH PROPOFOL N/A 02/19/2019   Procedure: ESOPHAGOGASTRODUODENOSCOPY (EGD) WITH PROPOFOL;  Surgeon: Ariel Hensley;  Location: [facility] ENDOSCOPY;  Service: Gastroenterology;  Laterality: N/A;  . TUBAL LIGATION      Gynecologic History: Patient's last menstrual period was 09/23/2009 (approximate).  Obstetric History: [id number], s/p SVD x 4  Family History  Problem Relation Age of Onset  . Diabetes Mother   . Diabetes Maternal Grandmother   . Uterine cancer Maternal Grandmother   . Heart attack Paternal Grandfather   . Breast cancer Paternal Aunt 77  . Lymphoma Father   . Colon cancer Maternal Aunt     Social History   Socioeconomic History  . Marital status: Married    Spouse name: Not on file  . Number of children: Not on file  . Years of education: Not on file  . Highest education level: Not on file  Occupational History  . Not on file  Tobacco Use  . Smoking status: Current Every Day Smoker    Packs/day: 1.50    Years: 20.00    Pack years: 30.00  . Smokeless tobacco: Never Used  Substance and Sexual Activity  .  Alcohol use: Not Currently  . Drug use: No  . Sexual activity: Not Currently    Birth control/protection: None  Other Topics Concern  . Not on file  Social History Narrative  . Not on file   Social Determinants of Health   Financial Resource Strain:   . Difficulty of Paying Living Expenses:   Food Insecurity:   . Worried About Charity fundraiser in the Last Year:   . Arboriculturist in the Last Year:   Transportation Needs:   . Film/video editor (Medical):   Marland Kitchen Lack of Transportation (Non-Medical):   Physical Activity:   . Days of Exercise per Week:   . Minutes of Exercise per Session:    Stress:   . Feeling of Stress :   Social Connections:   . Frequency of Communication with Friends and Family:   . Frequency of Social Gatherings with Friends and Family:   . Attends Religious Services:   . Active Member of Clubs or Organizations:   . Attends Archivist Meetings:   Marland Kitchen Marital Status:   Intimate Partner Violence:   . Fear of Current or Ex-Partner:   . Emotionally Abused:   Marland Kitchen Physically Abused:   . Sexually Abused:     No Known Allergies  Prior to Admission medications   Medication Sig Start Date End Date Taking? Authorizing Provider  albuterol (VENTOLIN HFA) 108 (90 Base) MCG/ACT inhaler Inhale into the lungs. 05/13/12  Yes Provider, Historical, Hensley  Cholecalciferol (VITAMIN D3) 50 MCG (2000 UT) capsule Take by mouth.   Yes Provider, Historical, Hensley  dicyclomine (BENTYL) 10 MG capsule Take 1 capsule (10 mg total) by mouth 4 (four) times daily -  before meals and at bedtime. 04/26/19  Yes [name], [name], Hensley  sertraline (ZOLOFT) 100 MG tablet Take 100 mg by mouth daily. 01/22/19  Yes Provider, Historical, Hensley  traZODone (DESYREL) 50 MG tablet TAKE 1 TABLET BY MOUTH NIGHTLY AT BEDTIME 01/25/19  Yes Provider, Historical, Hensley    Review of Systems  Constitutional: Negative.   HENT: Negative.   Eyes: Negative.   Respiratory: Negative.   Cardiovascular: Negative.   Gastrointestinal: Negative.   Genitourinary: Negative.   Musculoskeletal: Negative.   Skin: Negative.   Neurological: Negative.   Psychiatric/Behavioral: Negative.      Physical Exam BP 118/78   Pulse 72   Ht 5\' 8"  (1.727 m)   Wt 119 lb (54 kg)   LMP 09/23/2009 (Approximate)   BMI 18.09 kg/m  Patient's last menstrual period was 09/23/2009 (approximate). Physical Exam Constitutional:      General: She is not in acute distress.    Appearance: Normal appearance.  HENT:     Head: Normocephalic and atraumatic.  Eyes:     General: No scleral icterus.    Conjunctiva/sclera: Conjunctivae normal.   Neurological:     General: No focal deficit present.     Mental Status: She is alert and oriented to person, place, and time.     Cranial Nerves: No cranial nerve deficit.  Psychiatric:        Mood and Affect: Mood normal.        Behavior: Behavior normal.        Judgment: Judgment normal.     Female chaperone present for pelvic and breast  portions of the physical exam  Assessment: 54 y.o. [id number] female here for  1. Elevated serum hCG      Plan: Problem List Items Addressed This  Visit    None    Visit Diagnoses    Elevated serum hCG    -  Primary   Relevant Orders   Beta hCG quant (ref lab)   LH   Estradiol   Follicle stimulating hormone     Assess for menopause-related hCG level rise.  Consider trending the hCG level and, if indicated look for alternative explanations.  I have also requested records from her primary care provider, which include Pap smear results, as she reports them as abnormal.  We will continue to follow until reasonable explanation is obtained.  This is a new problem and has potentially serious repercussions, depending on the etiology.  A total of 45 minutes were spent face-to-face with the patient as well as preparation, review, communication, and documentation during this encounter.    Thomasene Mohair, Hensley 05/30/2019 5:26 PM    CC: Ariel Hensley [address] [place],  [place] [address]

## 2019-05-31 LAB — ESTRADIOL: Estradiol: 6.5 pg/mL

## 2019-05-31 LAB — FOLLICLE STIMULATING HORMONE: FSH: 94.3 m[IU]/mL

## 2019-05-31 LAB — BETA HCG QUANT (REF LAB): hCG Quant: 6 m[IU]/mL

## 2019-05-31 LAB — LUTEINIZING HORMONE: LH: 61 m[IU]/mL

## 2019-06-06 ENCOUNTER — Other Ambulatory Visit: Payer: Self-pay | Admitting: Obstetrics and Gynecology

## 2019-06-06 ENCOUNTER — Telehealth: Payer: Self-pay | Admitting: Obstetrics and Gynecology

## 2019-06-06 DIAGNOSIS — R7989 Other specified abnormal findings of blood chemistry: Secondary | ICD-10-CM

## 2019-06-06 DIAGNOSIS — E349 Endocrine disorder, unspecified: Secondary | ICD-10-CM

## 2019-06-06 NOTE — Telephone Encounter (Signed)
Discussed hCG level of 6, which is in the normal range for a menopausal female.  Her levels correlate to an elevated, menopausal level of LH and FSH, which suggests a pituitary source for hCG production.  Her CT scan showed no pelvic pathology that would suggest an extra-pituitary source for hCG production.  So, her findings of hCG levels of 9 (02/2019) and 6 (04/2019) are likely physiologic. Recommend a follow up level of hCG in 6 months. If these values are normal or within a similar range, then no further follow up beyond routine would be recommended.  She voiced understanding.

## 2019-06-11 ENCOUNTER — Other Ambulatory Visit: Payer: Self-pay | Admitting: Gastroenterology

## 2019-06-11 NOTE — Telephone Encounter (Signed)
Last office visit 04/18/2019 Macrocytosis chronic diarrhea  Last refill   04/26/2019 0 refills 90 tab

## 2019-06-29 ENCOUNTER — Telehealth: Payer: Self-pay | Admitting: Gastroenterology

## 2019-06-29 DIAGNOSIS — Z8619 Personal history of other infectious and parasitic diseases: Secondary | ICD-10-CM

## 2019-07-04 ENCOUNTER — Other Ambulatory Visit: Payer: Self-pay

## 2019-07-04 DIAGNOSIS — Z8619 Personal history of other infectious and parasitic diseases: Secondary | ICD-10-CM

## 2019-07-04 NOTE — Telephone Encounter (Signed)
Patient called & l/m on v/m asking Dr Verdis Prime nurse to call her @ [phone].

## 2019-07-04 NOTE — Telephone Encounter (Signed)
Patient verbalized understanding of results. Patient states that she will go to the [facility] at [facility]

## 2019-07-04 NOTE — Telephone Encounter (Signed)
Patient is having diarrhea that started last week. Patient states see a little blood on toilet paper and it has some time in the stool. Patient states she is having the diarrhea 4 times a day every day since last week. Denies any abdominal pain or nausea. Patient is still taking the dicyclomine with every meal and bedtime

## 2019-07-04 NOTE — Addendum Note (Signed)
Addended by: Radene Knee L on: 07/04/2019 04:31 PM   Modules accepted: Orders

## 2019-07-04 NOTE — Telephone Encounter (Signed)
With recurrence of diarrhea, recommend checking C. Difficile She was treated for C. difficile in the past.  Order placed  RV

## 2019-07-18 ENCOUNTER — Other Ambulatory Visit: Payer: Self-pay | Admitting: Gastroenterology

## 2019-08-14 ENCOUNTER — Other Ambulatory Visit: Payer: Self-pay | Admitting: Gastroenterology

## 2019-08-14 NOTE — Telephone Encounter (Signed)
Last office visit 04/18/2019 Macrocytosis without anemia, chronic diarrhea, bloating  Last refill 07/18/2019 0 refills

## 2019-09-22 ENCOUNTER — Other Ambulatory Visit: Payer: Self-pay | Admitting: Gastroenterology

## 2019-09-24 NOTE — Telephone Encounter (Signed)
Last office visit 04/18/2019  Last refill 08/14/2019 0 refills  No appointment

## 2019-10-21 ENCOUNTER — Other Ambulatory Visit: Payer: Self-pay | Admitting: Gastroenterology

## 2021-03-24 ENCOUNTER — Other Ambulatory Visit: Payer: Self-pay

## 2021-03-24 ENCOUNTER — Emergency Department
Admission: EM | Admit: 2021-03-24 | Discharge: 2021-03-24 | Disposition: A | Discharge status: Home / Self Care | Payer: BC Managed Care – PPO | Attending: Emergency Medicine | Admitting: Emergency Medicine

## 2021-03-24 ENCOUNTER — Emergency Department: Payer: BC Managed Care – PPO

## 2021-03-24 DIAGNOSIS — I4891 Unspecified atrial fibrillation: Secondary | ICD-10-CM | POA: Diagnosis present

## 2021-03-24 LAB — TSH: TSH: 1.866 u[IU]/mL (ref 0.350–4.500)

## 2021-03-24 LAB — BASIC METABOLIC PANEL
Anion gap: 10 (ref 5–15)
BUN: 13 mg/dL (ref 6–20)
CO2: 23 mmol/L (ref 22–32)
Calcium: 9 mg/dL (ref 8.9–10.3)
Chloride: 105 mmol/L (ref 98–111)
Creatinine, Ser: 0.73 mg/dL (ref 0.44–1.00)
GFR, Estimated: >60 mL/min (ref >60)
Glucose, Bld: 103 mg/dL — ABNORMAL HIGH (ref 70–99)
Potassium: 3.6 mmol/L (ref 3.5–5.1)
Sodium: 138 mmol/L (ref 135–145)

## 2021-03-24 LAB — CBC
HCT: 42.6 % (ref 36.0–46.0)
Hemoglobin: 13.8 g/dL (ref 12.0–15.0)
MCH: 31.2 pg (ref 26.0–34.0)
MCHC: 32.4 g/dL (ref 30.0–36.0)
MCV: 96.4 fL (ref 80.0–100.0)
Platelets: 314 10*3/uL (ref 150–400)
RBC: 4.42 MIL/uL (ref 3.87–5.11)
RDW: 12.2 % (ref 11.5–15.5)
WBC: 9.4 10*3/uL (ref 4.0–10.5)
nRBC: 0 % (ref 0.0–0.2)

## 2021-03-24 LAB — MAGNESIUM: Magnesium: 2.1 mg/dL (ref 1.7–2.4)

## 2021-03-24 LAB — PROTIME-INR
INR: 0.9 (ref 0.8–1.2)
Prothrombin Time: 11.9 seconds (ref 11.4–15.2)

## 2021-03-24 MED ORDER — DILTIAZEM HCL 25 MG/5ML IV SOLN
20.0000 mg | Freq: Once | INTRAVENOUS | Status: AC
  Administered 2021-03-24: 20 mg via INTRAVENOUS
  Filled 2021-03-24: qty 5

## 2021-03-24 MED ORDER — MAGNESIUM SULFATE 2 GM/50ML IV SOLN
2.0000 g | Freq: Once | INTRAVENOUS | Status: AC
  Administered 2021-03-24: 2 g via INTRAVENOUS
  Filled 2021-03-24: qty 50

## 2021-03-24 MED ORDER — AMIODARONE IV BOLUS ONLY 150 MG/100ML
150.0000 mg | Freq: Once | INTRAVENOUS | Status: AC
  Administered 2021-03-24: 150 mg via INTRAVENOUS
  Filled 2021-03-24: qty 100

## 2021-03-24 MED ORDER — ETOMIDATE 2 MG/ML IV SOLN
20.0000 mg | Freq: Once | INTRAVENOUS | Status: AC
  Administered 2021-03-24: 10 mg via INTRAVENOUS
  Filled 2021-03-24: qty 10

## 2021-03-24 MED ORDER — ASPIRIN EC 81 MG PO TBEC: 81.0000 mg | DELAYED_RELEASE_TABLET | Freq: Every day | ORAL | 0 refills | Status: AC

## 2021-03-24 MED ORDER — FENTANYL CITRATE PF 50 MCG/ML IJ SOSY
50.0000 ug | PREFILLED_SYRINGE | Freq: Once | INTRAMUSCULAR | Status: AC
  Administered 2021-03-24: 50 ug via INTRAVENOUS
  Filled 2021-03-24: qty 1

## 2021-03-24 MED ORDER — SODIUM CHLORIDE 0.9 % IV BOLUS
1000.0000 mL | Freq: Once | INTRAVENOUS | Status: AC
  Administered 2021-03-24: 1000 mL via INTRAVENOUS

## 2021-03-24 MED ORDER — AMIODARONE HCL 200 MG PO TABS: ORAL_TABLET | ORAL | 0 refills | Status: AC

## 2021-03-24 NOTE — ED Triage Notes (Signed)
Pt c/o feeling her heart rate fast and irregular started last night when she laid down and seemed to go away and this morning starting again, has a hx of the same but has not had issues is years, denies pain, having some SOB, pt is in NAD on arrival ?

## 2021-03-24 NOTE — Discharge Instructions (Signed)
Your amiodarone prescription starts with a higher dose "loading" phase for the first 12 days, and then change to a lower dose "maintenance" phase. ?Take 2 tablets (400 mg total) by mouth 2 (two) times daily for 12 days, THEN 1 tablet (200 mg total) daily thereafter.  ?

## 2021-03-24 NOTE — ED Provider Notes (Signed)
[facility] Provider Note    Event Date/Time   First MD Initiated Contact with Patient 03/24/21 [id number]     (approximate)   History   Atrial Fibrillation   HPI  Ariel Hensley is a 56 y.o. female  with a past history of atrial fibrillation who comes ED complaining of racing heart rate that started last night and has been persistent since then.  No chest pain or shortness of breath.  Feels somewhat fatigued.  No recent illness or fever.  No new medications.  She reports a history of atrial fibrillation, used to be on amiodarone and metoprolol but stopped taking them about 5 years ago due to the expense.  She reports sometimes she will get a brief episode of rapid heart rate which resolves on its own after a short time     Physical Exam   Triage Vital Signs: ED Triage Vitals  Enc Vitals Group     BP 03/24/21 0840 (!) 133/110     Pulse Rate 03/24/21 0840 (!) 147     Resp 03/24/21 0840 17     Temp 03/24/21 0843 98.4 F (36.9 C)     Temp Source 03/24/21 0840 Oral     SpO2 03/24/21 0840 97 %     Weight 03/24/21 0840 125 lb (56.7 kg)     Height 03/24/21 0840  (1.753 m)     Head Circumference --      Peak Flow --      Pain Score 03/24/21 0840 0     Pain Loc --      Pain Edu? --      Excl. in GC? --     Most recent vital signs: Vitals:   03/24/21 1233 03/24/21 1243  BP: 111/67 119/78  Pulse: (!) 106 84  Resp: 17 17  Temp:    SpO2: 98% 98%     General: Awake, no distress.  CV:  Good peripheral perfusion.  Irregularly irregular rhythm, tachycardia heart rate 140-170 Resp:  Normal effort.  Clear to auscultation bilaterally Abd:  No distention.  Soft and nontender Other:  No lower extremity edema or calf swelling or calf tenderness or asymmetry.   ED Results / Procedures / Treatments   Labs (all labs ordered are listed, but only abnormal results are displayed) Labs Reviewed  BASIC METABOLIC PANEL - Abnormal; Notable for the following  components:      Result Value   Glucose, Bld 103 (*)    All other components within normal limits  CBC  PROTIME-INR  MAGNESIUM  TSH     EKG  Interpreted by me Atrial fibrillation with tachycardia, heart rate 143.  Normal axis and intervals.  Normal QRS ST segments and T waves.   RADIOLOGY Chest x-ray viewed and interpreted by me, unremarkable.  Radiology report reviewed    PROCEDURES:  Critical Care performed: Yes, see critical care procedure note(s)  .1-3 Lead EKG Interpretation Performed by: Sharman Cheek, MD Authorized by: Sharman Cheek, MD     Interpretation: abnormal     ECG rate:  140   ECG rate assessment: tachycardic     Rhythm: atrial fibrillation     Ectopy: none     Conduction: normal   Comments:        .Critical Care Performed by: Sharman Cheek, MD Authorized by: Sharman Cheek, MD   Critical care provider statement:    Critical care time (minutes):  35   Critical care time was exclusive  of:  Separately billable procedures and treating other patients   Critical care was necessary to treat or prevent imminent or life-threatening deterioration of the following conditions:  Cardiac failure and circulatory failure   Critical care was time spent personally by me on the following activities:  Development of treatment plan with patient or surrogate, discussions with consultants, evaluation of patient's response to treatment, examination of patient, obtaining history from patient or surrogate, ordering and performing treatments and interventions, ordering and review of laboratory studies, ordering and review of radiographic studies, pulse oximetry, re-evaluation of patient's condition and review of old charts Comments:        .Cardioversion  Date/Time: 03/24/2021 12:47 PM Performed by: Sharman Cheek, MD Authorized by: Sharman Cheek, MD   Consent:    Consent obtained:  Written   Consent given by:  Patient   Risks discussed:   Cutaneous burn, induced arrhythmia and pain   Alternatives discussed:  Rate-control medication, referral and anti-coagulation medication Pre-procedure details:    Cardioversion basis:  Elective   Rhythm:  Atrial fibrillation   Electrode placement:  Anterior-posterior Patient sedated: Yes. Refer to sedation procedure documentation for details of sedation.  Attempt one:    Cardioversion mode:  Synchronous   Waveform:  Biphasic   Shock (Joules):  200   Shock outcome:  Conversion to normal sinus rhythm Post-procedure details:    Patient status:  Awake   Patient tolerance of procedure:  Tolerated well, no immediate complications Comments:         .Sedation  Date/Time: 03/24/2021 12:48 PM Performed by: Sharman Cheek, MD Authorized by: Sharman Cheek, MD   Consent:    Consent obtained:  Written   Consent given by:  Patient   Risks discussed:  Allergic reaction, inadequate sedation, vomiting and respiratory compromise necessitating ventilatory assistance and intubation   Alternatives discussed:  Analgesia without sedation Universal protocol:    Procedure explained and questions answered to patient or proxy's satisfaction: yes     Relevant documents present and verified: yes     Test results available: yes     Required blood products, implants, devices, and special equipment available: yes     Immediately prior to procedure, a time out was called: yes     Patient identity confirmed:  Verbally with patient and arm band Indications:    Procedure performed:  Cardioversion   Procedure necessitating sedation performed by:  Physician performing sedation Pre-sedation assessment:    Time since last food or drink:  6 hours   ASA classification: class 2 - patient with mild systemic disease     Mouth opening:  3 or more finger widths   Thyromental distance:  4 finger widths   Mallampati score:  I - soft palate, uvula, fauces, pillars visible   Neck mobility: normal     Pre-sedation  assessments completed and reviewed: airway patency, cardiovascular function, hydration status, mental status, nausea/vomiting, pain level and respiratory function     Pre-sedation assessment completed:  03/24/2021 12:00 PM Immediate pre-procedure details:    Reassessment: Patient reassessed immediately prior to procedure     Reviewed: vital signs, relevant labs/tests and NPO status     Verified: bag valve mask available, emergency equipment available, intubation equipment available, IV patency confirmed, oxygen available and suction available   Procedure details (see MAR for exact dosages):    Preoxygenation:  Room air   Intended level of sedation: deep   Analgesia:  Fentanyl   Intra-procedure monitoring:  Cardiac monitor, blood pressure monitoring,  continuous pulse oximetry, frequent LOC assessments and frequent vital sign checks   Intra-procedure events: none     Total Provider sedation time (minutes):  15 Post-procedure details:    Post-sedation assessment completed:  03/24/2021 12:51 PM   Attendance: Constant attendance by certified staff until patient recovered     Recovery: Patient returned to pre-procedure baseline     Post-sedation assessments completed and reviewed: airway patency, cardiovascular function, hydration status, mental status, nausea/vomiting, pain level and respiratory function     Patient is stable for discharge or admission: yes     Procedure completion:  Tolerated well, no immediate complications   MEDICATIONS ORDERED IN ED: Medications  sodium chloride 0.9 % bolus 1,000 mL (0 mLs Intravenous Stopped 03/24/21 1020)  diltiazem (CARDIZEM) injection 20 mg (20 mg Intravenous Given 03/24/21 0855)  magnesium sulfate IVPB 2 g 50 mL (0 g Intravenous Stopped 03/24/21 1020)  amiodarone (NEXTERONE) IV bolus only 150 mg/100 mL (0 mg Intravenous Stopped 03/24/21 1212)  etomidate (AMIDATE) injection 20 mg (10 mg Intravenous Given 03/24/21 1236)  fentaNYL (SUBLIMAZE) injection 50 mcg (50 mcg  Intravenous Given 03/24/21 1236)     IMPRESSION / MDM / ASSESSMENT AND PLAN / ED COURSE  I reviewed the triage vital signs and the nursing notes.                              Differential diagnosis includes, but is not limited to, paroxysmal atrial fibrillation, hypothyroidism, dehydration, electrolyte abnormality  **The patient is on the cardiac monitor to evaluate for evidence of arrhythmia and/or significant heart rate changes.**}  Patient presents with atrial fibrillation with RVR which appears to be a recurrent of underlying PAF.  Will give IV fluids, IV magnesium bolus, IV diltiazem bolus for rate control.  Will check labs and chest x-ray. Clinical Course as of 03/24/21 1346  Wed Mar 24, 2021  1049 Patient still in atrial fibrillation, rate controlled with a heart rate of 80-100.  She feels better.  Discussed possibility of electrocardioversion given recurrence is only been going on for the last 12 hours.  In the meantime she is also verified that she should be able to obtain this medicine for less than $10 a month which will be affordable.  She wishes to restart this medicine since it worked well for her in the past.  Given that, I will give her an amiodarone bolus in the ED to see if this will pharmacologically convert her to sinus rhythm or at least increase the chance of success with electrocardioversion. [PS]  1214 Still in a-fib. Discussed ECV and pt wishes to proceed. [PS]  1253 Repeat EKG shows sinus rhythm, rate of 79.  Normal axis, normal intervals.  Normal QRS ST segments and T waves. [PS]  1345 Upright and back to baseline. Ready for discharge to f/u with cards.  [PS]    Clinical Course User Index [PS] Sharman CheekStafford, Megyn Leng, MD    ----------------------------------------- 12:51 PM on 03/24/2021 ----------------------------------------- Cardioversion completed, now in sinus rhythm, stable blood pressure.  Awake and alert.  Will p.o. trial and ensure she is back to  baseline.   FINAL CLINICAL IMPRESSION(S) / ED DIAGNOSES   Final diagnoses:  Atrial fibrillation with RVR (HCC)     Rx / DC Orders   ED Discharge Orders          Ordered    amiodarone (PACERONE) 200 MG tablet        03/24/21  1245    aspirin EC 81 MG tablet  Daily        03/24/21 1245             Note:  This document was prepared using Dragon voice recognition software and may include unintentional dictation errors.   Sharman Cheek, MD 03/24/21 1253

## 2021-03-30 IMAGING — CT CT ABD-PELV W/ CM
1 of 3 series · 14 of 32 positions shown, 19 images · IV contrast (APPLIED)
Comparison: None.

CLINICAL DATA: Diarrhea and weight loss.

EXAM:
CT ABDOMEN AND PELVIS WITH CONTRAST
TECHNIQUE: Multidetector CT imaging of the abdomen and pelvis was performed
using the standard protocol following bolus administration of
intravenous contrast.
CONTRAST:  75mL OMNIPAQUE IOHEXOL 300 MG/ML  SOLN

[Series 2: axial st · axial · 0.63mm/px · z∈[-860,-476]mm · 14 of 87 slices shown, 19 images]
[im 5/87  soft-tissue]
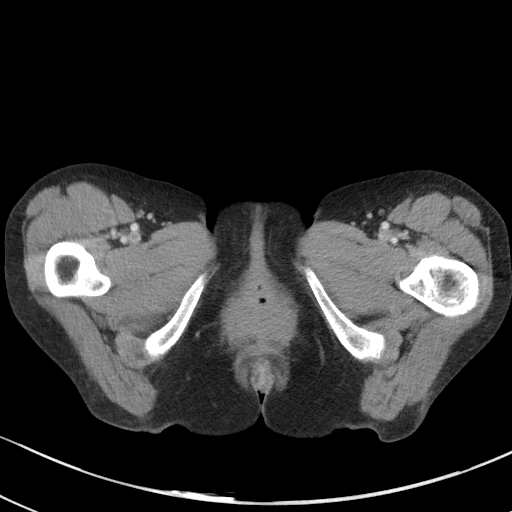
[im 5/87  bone]
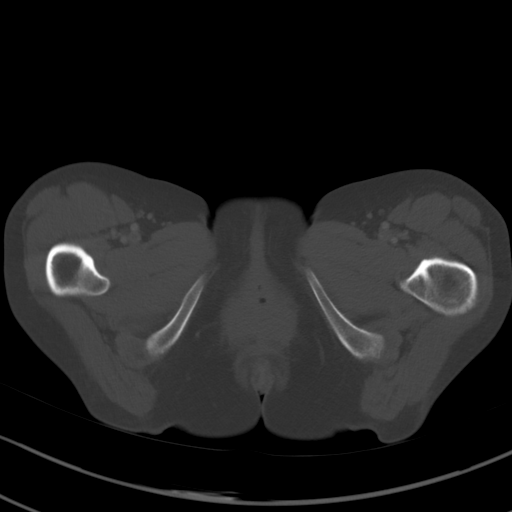
[im 10/87  soft-tissue]
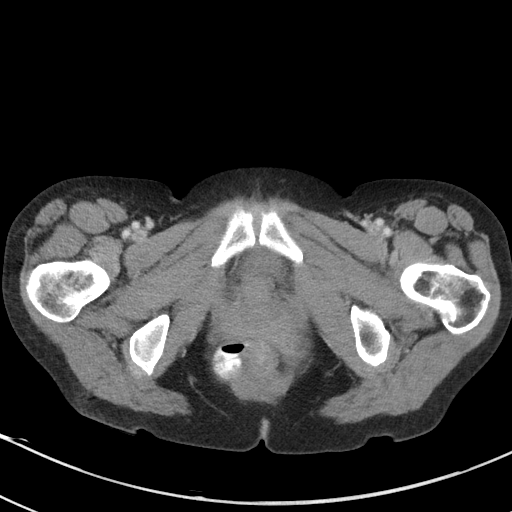
[im 20/87  soft-tissue]
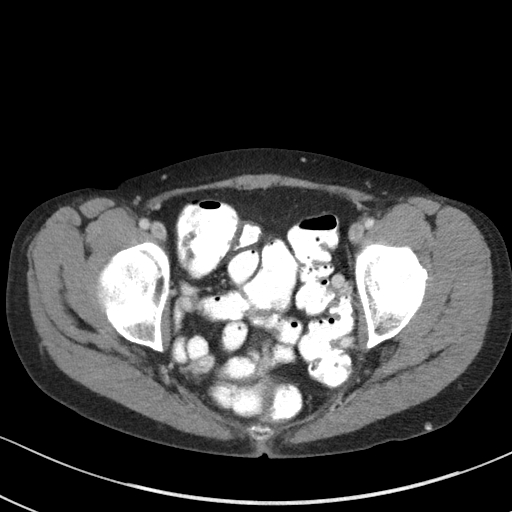
[im 24/87  soft-tissue]
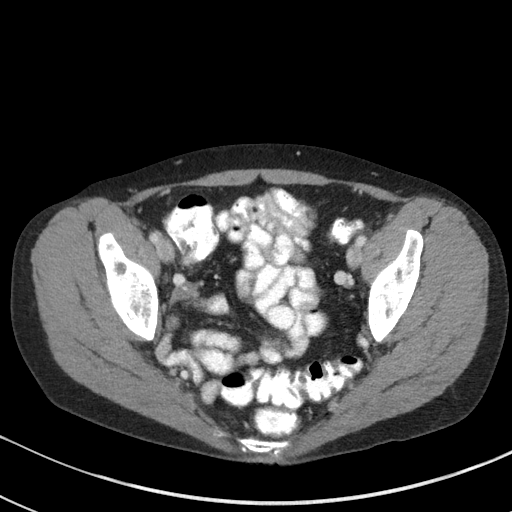
[im 29/87  soft-tissue]
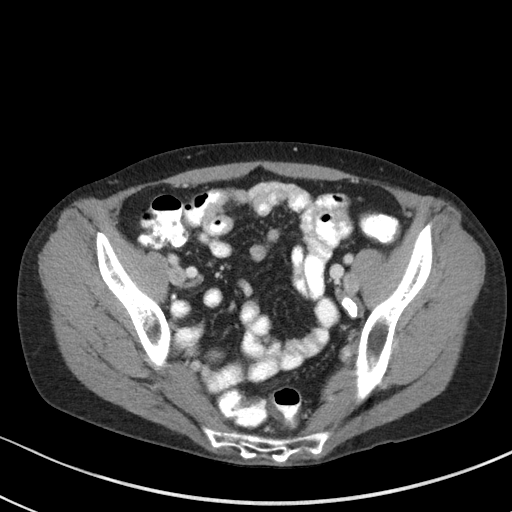
[im 39/87  soft-tissue]
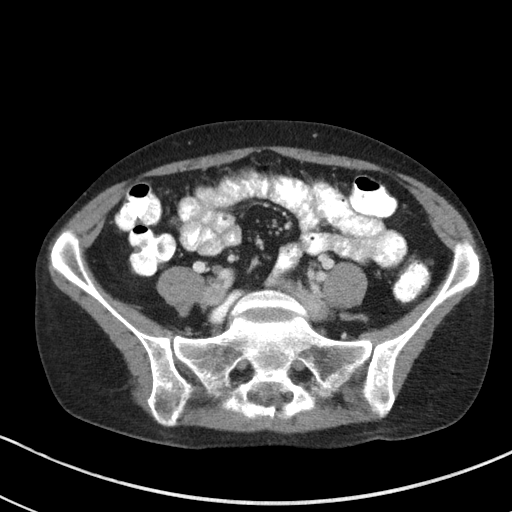
[im 44/87  soft-tissue]
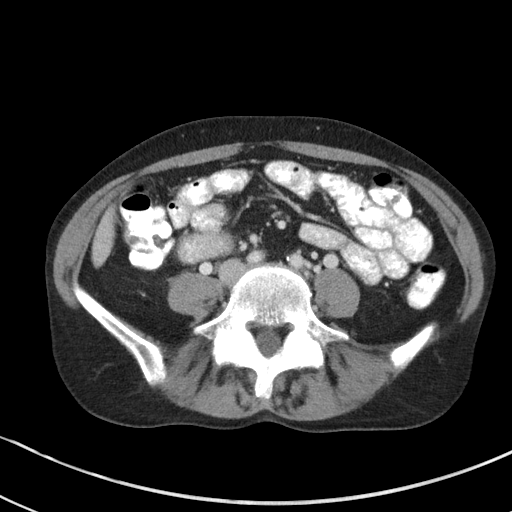
[im 48/87  soft-tissue]
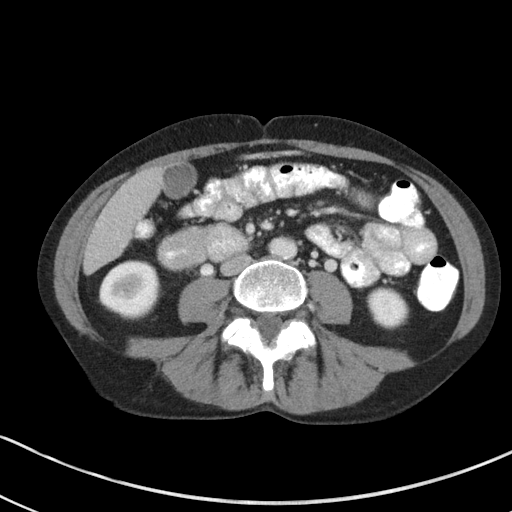
[im 58/87  soft-tissue]
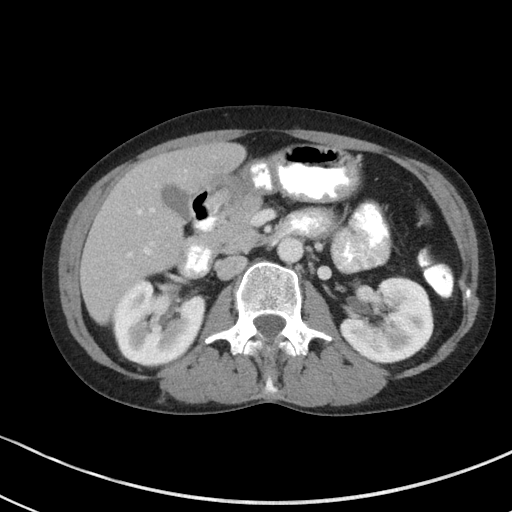
[im 58/87  bone]
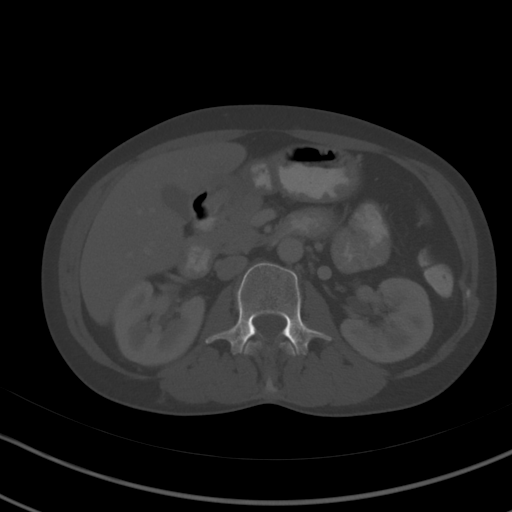
[im 63/87  soft-tissue]
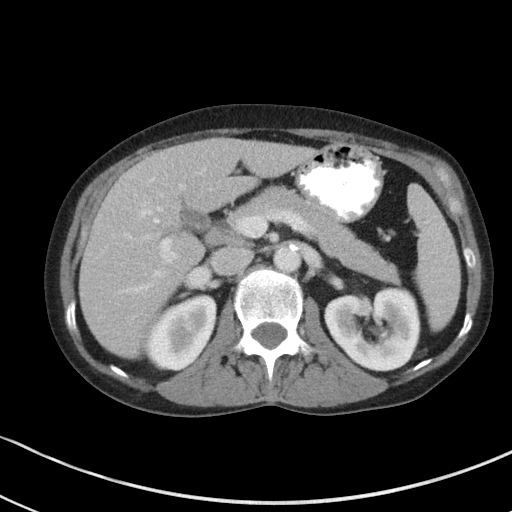
[im 67/87  soft-tissue]
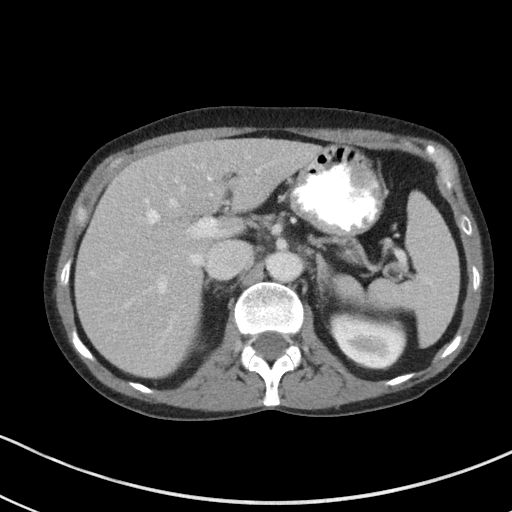
[im 67/87  lung]
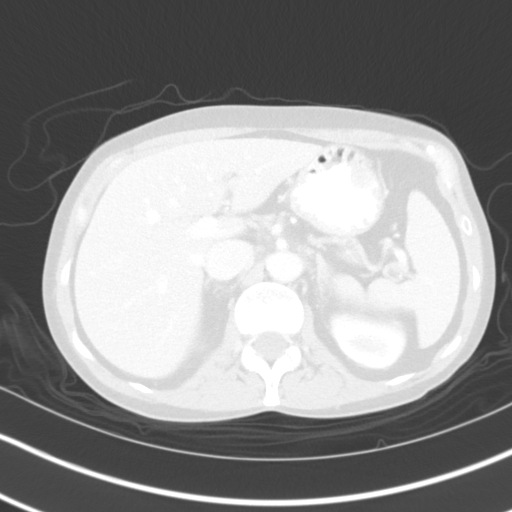
[im 72/87  lung]
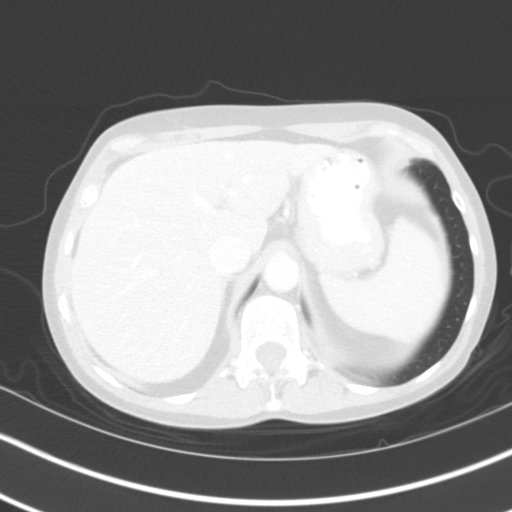
[im 77/87  soft-tissue]
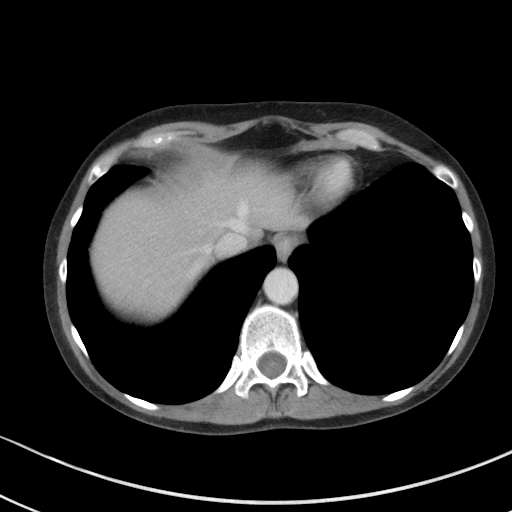
[im 77/87  lung]
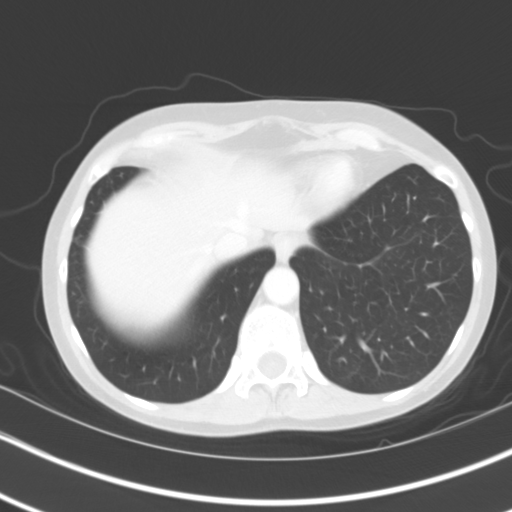
[im 82/87  soft-tissue]
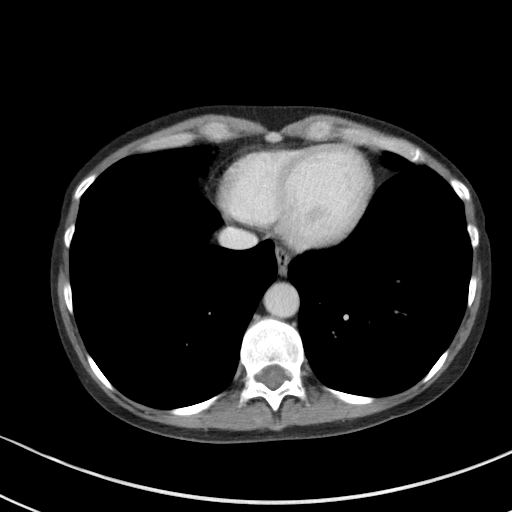
[im 82/87  lung]
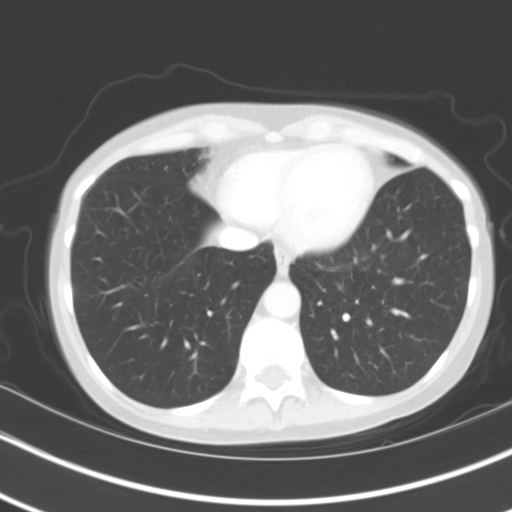

[14 of 32 positions shown; findings below may reference images not displayed]

FINDINGS: Lower chest: Lung bases are clear.

Hepatobiliary: Liver, gallbladder and biliary tree are normal.

Pancreas: Normal.

Spleen: Normal.

Adrenals/Urinary Tract: Adrenal glands are normal. Kidneys are
normal in size without focal mass or nephrolithiasis. No
hydronephrosis. Ureters and bladder are normal.

Stomach/Bowel: Stomach is unremarkable. Diverticula along the
concavity of the duodenal C sweep. Possible mild wall thickening of
the duodenum without adjacent free fluid or inflammatory change.
Remainder of the small bowel is normal. Appendix is not well
visualized. Cecum is in the midline pelvis just above the bladder.
Minimal diverticulosis of the colon.

Vascular/Lymphatic: Minimal calcified plaque over the abdominal
aorta. No adenopathy.

Reproductive: Uterus is somewhat retroverted. Several prominent
periuterine veins.

Other: No significant free fluid or focal inflammatory change.

Musculoskeletal: Unremarkable.
IMPRESSION: 1. No acute findings in the abdomen/pelvis. No focal mass or
adenopathy.

2. Minimal wall thickening of the duodenal C sweep without adjacent
inflammatory change or free fluid. Findings may be due to a chronic
regional enteritis of inflammatory nature.

3.  Minimal colonic diverticulosis.

4.  Aortic Atherosclerosis ([id number].X).

## 2022-12-24 DIAGNOSIS — Z8249 Family history of ischemic heart disease and other diseases of the circulatory system: Secondary | ICD-10-CM | POA: Diagnosis not present

## 2022-12-24 DIAGNOSIS — F419 Anxiety disorder, unspecified: Secondary | ICD-10-CM | POA: Diagnosis not present

## 2022-12-24 DIAGNOSIS — F324 Major depressive disorder, single episode, in partial remission: Secondary | ICD-10-CM | POA: Diagnosis not present

## 2022-12-24 DIAGNOSIS — Z720 Tobacco use: Secondary | ICD-10-CM | POA: Diagnosis not present

## 2022-12-24 DIAGNOSIS — I4891 Unspecified atrial fibrillation: Secondary | ICD-10-CM | POA: Diagnosis not present

## 2022-12-24 DIAGNOSIS — Z823 Family history of stroke: Secondary | ICD-10-CM | POA: Diagnosis not present

## 2022-12-24 DIAGNOSIS — G4700 Insomnia, unspecified: Secondary | ICD-10-CM | POA: Diagnosis not present

## 2023-03-22 DIAGNOSIS — Z720 Tobacco use: Secondary | ICD-10-CM | POA: Diagnosis not present

## 2023-03-22 DIAGNOSIS — Z7901 Long term (current) use of anticoagulants: Secondary | ICD-10-CM | POA: Diagnosis not present

## 2023-03-22 DIAGNOSIS — I480 Paroxysmal atrial fibrillation: Secondary | ICD-10-CM | POA: Diagnosis not present

## 2023-03-27 DIAGNOSIS — J40 Bronchitis, not specified as acute or chronic: Secondary | ICD-10-CM | POA: Diagnosis not present

## 2023-04-01 ENCOUNTER — Emergency Department
Admission: EM | Admit: 2023-04-01 | Discharge: 2023-04-01 | Disposition: A | Discharge status: Home / Self Care | Attending: Emergency Medicine | Admitting: Emergency Medicine

## 2023-04-01 ENCOUNTER — Emergency Department

## 2023-04-01 ENCOUNTER — Other Ambulatory Visit: Payer: Self-pay

## 2023-04-01 DIAGNOSIS — E876 Hypokalemia: Secondary | ICD-10-CM | POA: Insufficient documentation

## 2023-04-01 DIAGNOSIS — F17200 Nicotine dependence, unspecified, uncomplicated: Secondary | ICD-10-CM | POA: Diagnosis not present

## 2023-04-01 DIAGNOSIS — I10 Essential (primary) hypertension: Secondary | ICD-10-CM | POA: Insufficient documentation

## 2023-04-01 DIAGNOSIS — R531 Weakness: Secondary | ICD-10-CM | POA: Diagnosis present

## 2023-04-01 LAB — BASIC METABOLIC PANEL
Anion gap: 10 (ref 5–15)
BUN: 12 mg/dL (ref 6–20)
CO2: 22 mmol/L (ref 22–32)
Calcium: 8.8 mg/dL — ABNORMAL LOW (ref 8.9–10.3)
Chloride: 104 mmol/L (ref 98–111)
Creatinine, Ser: 0.78 mg/dL (ref 0.44–1.00)
GFR, Estimated: >60 mL/min (ref >60)
Glucose, Bld: 93 mg/dL (ref 70–99)
Potassium: 2.7 mmol/L — CL (ref 3.5–5.1)
Sodium: 136 mmol/L (ref 135–145)

## 2023-04-01 LAB — CBC
HCT: 38.8 % (ref 36.0–46.0)
Hemoglobin: 13.3 g/dL (ref 12.0–15.0)
MCH: 31.3 pg (ref 26.0–34.0)
MCHC: 34.3 g/dL (ref 30.0–36.0)
MCV: 91.3 fL (ref 80.0–100.0)
Platelets: 325 10*3/uL (ref 150–400)
RBC: 4.25 MIL/uL (ref 3.87–5.11)
RDW: 12.9 % (ref 11.5–15.5)
WBC: 11.5 10*3/uL — ABNORMAL HIGH (ref 4.0–10.5)
nRBC: 0 % (ref 0.0–0.2)

## 2023-04-01 LAB — RESP PANEL BY RT-PCR (RSV, FLU A&B, COVID)  RVPGX2
Influenza A by PCR: NEGATIVE
Influenza B by PCR: NEGATIVE
Resp Syncytial Virus by PCR: NEGATIVE
SARS Coronavirus 2 by RT PCR: NEGATIVE

## 2023-04-01 LAB — URINALYSIS, ROUTINE W REFLEX MICROSCOPIC
Bilirubin Urine: NEGATIVE
Glucose, UA: NEGATIVE mg/dL
Hgb urine dipstick: NEGATIVE
Ketones, ur: NEGATIVE mg/dL
Leukocytes,Ua: NEGATIVE
Nitrite: NEGATIVE
Protein, ur: NEGATIVE mg/dL
Specific Gravity, Urine: 1.011 (ref 1.005–1.030)
pH: 6 (ref 5.0–8.0)

## 2023-04-01 LAB — TROPONIN I (HIGH SENSITIVITY): Troponin I (High Sensitivity): 3 ng/L (ref <18)

## 2023-04-01 MED ORDER — SODIUM CHLORIDE 0.9 % IV BOLUS
1000.0000 mL | Freq: Once | INTRAVENOUS | Status: AC
  Administered 2023-04-01: 1000 mL via INTRAVENOUS

## 2023-04-01 MED ORDER — POTASSIUM CHLORIDE CRYS ER 20 MEQ PO TBCR: 20.0000 meq | EXTENDED_RELEASE_TABLET | Freq: Two times a day (BID) | ORAL | 0 refills | Status: AC

## 2023-04-01 MED ORDER — POTASSIUM CHLORIDE CRYS ER 20 MEQ PO TBCR
40.0000 meq | EXTENDED_RELEASE_TABLET | Freq: Once | ORAL | Status: AC
  Administered 2023-04-01: 40 meq via ORAL
  Filled 2023-04-01: qty 2

## 2023-04-01 NOTE — Discharge Instructions (Addendum)
 A prescription for potassium was sent to the pharmacy for you begin taking 1 tablet twice a day for the next 7 days.  You had the first dose here in the emergency department and the next dose will be this evening.  Also your potassium level will need to be rechecked.  Since you are trying to get an established doctor at [facility] you can go to the acute care at [facility] to have your blood redrawn in 7 days.  Return to the emergency department if any severe worsening of your symptoms or urgent concerns.

## 2023-04-01 NOTE — ED Notes (Signed)
 Patient denies N/V/D. States "I'm just not hungry." Patient drove herself to the hospital and walked in, but was transported from triage to the room via wheelchair.

## 2023-04-01 NOTE — ED Notes (Signed)
Patient ambulated to hallway bathroom with a steady gait. 

## 2023-04-01 NOTE — ED Notes (Signed)
 Patient ambulated to and from the hallway bathroom with a steady gait.

## 2023-04-01 NOTE — ED Triage Notes (Addendum)
 First nurse note: Pt to ED via POV from [facility]. Pt was sen on 3/10 and tx for broncihitis and given prednisone and doxycycline. Pt reports increased weakness anf fatigue x1 wk. Last meal was Tuesday and last drink was 8hrs ago. Pt reports minimal UO. Pt sent to ED for possible dehyrdration.

## 2023-04-01 NOTE — ED Provider Notes (Signed)
 [facility] Provider Note    Event Date/Time   First MD Initiated Contact with Patient 04/01/23 1000     (approximate)   History   Weakness   HPI  Ariel Hensley is a 58 y.o. female   presents to the ED after being seen at [facility] today with concerns of possible dehydration.  Patient was also previously seen on 03/27/2023 and treated for bronchitis with prednisone and doxycycline.  She states that her symptoms for the bronchitis have improved with taking the medication.  She reports increased weakness and fatigue for the past week.  Decreased appetite but continues to drink fluids.  Patient denies any urinary symptoms or known fever.  Also unaware of any known sick exposures.  Patient has history of hypertension, atrial fibs, pneumonia, anxiety.  Patient was a smoker prior to this current illness.      Physical Exam   Triage Vital Signs: ED Triage Vitals  Encounter Vitals Group     BP 04/01/23 0925 138/89     Systolic BP Percentile --      Diastolic BP Percentile --      Pulse Rate 04/01/23 0925 67     Resp 04/01/23 0925 20     Temp 04/01/23 0925 98.7 F (37.1 C)     Temp src --      SpO2 04/01/23 0925 97 %     Weight --      Height --      Head Circumference --      Peak Flow --      Pain Score 04/01/23 0920 3     Pain Loc --      Pain Education --      Exclude from Growth Chart --     Most recent vital signs: Vitals:   04/01/23 1130 04/01/23 1200  BP: (!) 141/88 129/70  Pulse: 63 62  Resp: 15 19  Temp:    SpO2: 96% 97%     General: Awake, no distress.  Alert, oriented, able answer questions appropriately without any noticeable shortness of breath. CV:  Good peripheral perfusion.  Heart regular rate and rhythm. Resp:  Normal effort.  Lungs are clear bilaterally. Abd:  No distention.  Other:     ED Results / Procedures / Treatments   Labs (all labs ordered are listed, but only abnormal results are displayed) Labs  Reviewed  BASIC METABOLIC PANEL - Abnormal; Notable for the following components:      Result Value   Potassium 2.7 (*)    Calcium 8.8 (*)    All other components within normal limits  CBC - Abnormal; Notable for the following components:   WBC 11.5 (*)    All other components within normal limits  URINALYSIS, ROUTINE W REFLEX MICROSCOPIC - Abnormal; Notable for the following components:   Color, Urine STRAW (*)    APPearance CLEAR (*)    All other components within normal limits  RESP PANEL BY RT-PCR (RSV, FLU A&B, COVID)  [id number]  TROPONIN I (HIGH SENSITIVITY)     EKG  Vent. rate 63 BPM PR interval 152 ms QRS duration 84 ms QT/QTcB 478/489 ms P-R-T axes 56 64 50 Normal sinus rhythm Septal infarct , age undetermined Abnormal ECG    RADIOLOGY Chest x-ray images were reviewed and interpreted by myself independent of the radiologist and was negative for any acute cardiopulmonary changes.  Radiology report also agrees.    PROCEDURES:  Critical Care performed:  Procedures   MEDICATIONS ORDERED IN ED: Medications  sodium chloride 0.9 % bolus 1,000 mL (0 mLs Intravenous Stopped 04/01/23 1224)  potassium chloride SA (KLOR-CON M) CR tablet 40 mEq (40 mEq Oral Given 04/01/23 1043)     IMPRESSION / MDM / ASSESSMENT AND PLAN / ED COURSE  I reviewed the triage vital signs and the nursing notes.   Differential diagnosis includes, but is not limited to, dehydration, urinary tract infection, COVID, RSV, influenza, bronchitis, pneumonia, hypokalemia, hyponatremia.  58 year old female presents to the ED after diagnosis with bronchitis and finishing her antibiotics and steroids.  She was seen at [facility] and sent to the emergency room with the assumption that she was dehydrated.  Patient was reassured with respiratory panel being negative, chest x-ray and EKG were unremarkable.  BMP did show that patient was hypokalemic with a potassium of 2.7.  Patient was not having  difficulty with nausea, vomiting or diarrhea.  She tolerated p.o. potassium well while in the emergency department was feeling better prior to discharge.  A prescription for the same was sent to the pharmacy for her to continue taking.  She is currently waiting to get a PCP at [facility].  She was encouraged to go to the acute clinic side of [facility] and have her potassium redrawn in 1 week.  Patient was given a note to remain out of work until Tuesday.  She is aware that she should return to the emergency department if any severe worsening of her symptoms or urgent concerns.      Patient's presentation is most consistent with acute illness / injury with system symptoms.  FINAL CLINICAL IMPRESSION(S) / ED DIAGNOSES   Final diagnoses:  Hypokalemia     Rx / DC Orders   ED Discharge Orders          Ordered    potassium chloride SA (KLOR-CON M) 20 MEQ tablet  2 times daily        04/01/23 1215             Note:  This document was prepared using Dragon voice recognition software and may include unintentional dictation errors.   Tommi Rumps, PA-C 04/01/23 1333    Jene Every, MD 04/01/23 1455

## 2023-04-01 NOTE — ED Notes (Signed)
 Patient placed on cardiac leads.

## 2023-04-01 NOTE — ED Notes (Signed)
 Potassium is 2.7. Dr. Cyril Loosen informed.

## 2023-04-10 DIAGNOSIS — E876 Hypokalemia: Secondary | ICD-10-CM | POA: Diagnosis not present

## 2023-04-10 DIAGNOSIS — D72829 Elevated white blood cell count, unspecified: Secondary | ICD-10-CM | POA: Diagnosis not present

## 2023-04-14 DIAGNOSIS — Z7901 Long term (current) use of anticoagulants: Secondary | ICD-10-CM | POA: Diagnosis not present

## 2023-04-14 DIAGNOSIS — I10 Essential (primary) hypertension: Secondary | ICD-10-CM | POA: Diagnosis not present

## 2023-04-14 DIAGNOSIS — I480 Paroxysmal atrial fibrillation: Secondary | ICD-10-CM | POA: Diagnosis not present

## 2023-04-14 DIAGNOSIS — Z720 Tobacco use: Secondary | ICD-10-CM | POA: Diagnosis not present

## 2023-09-26 ENCOUNTER — Other Ambulatory Visit: Payer: Self-pay

## 2023-09-26 ENCOUNTER — Encounter: Payer: Self-pay | Admitting: Emergency Medicine

## 2023-09-26 ENCOUNTER — Emergency Department: Admission: EM | Admit: 2023-09-26 | Discharge: 2023-09-26 | Disposition: A | Discharge status: Home / Self Care

## 2023-09-26 DIAGNOSIS — M542 Cervicalgia: Secondary | ICD-10-CM | POA: Diagnosis present

## 2023-09-26 DIAGNOSIS — I10 Essential (primary) hypertension: Secondary | ICD-10-CM | POA: Insufficient documentation

## 2023-09-26 DIAGNOSIS — Y92410 Unspecified street and highway as the place of occurrence of the external cause: Secondary | ICD-10-CM | POA: Diagnosis not present

## 2023-09-26 DIAGNOSIS — S161XXA Strain of muscle, fascia and tendon at neck level, initial encounter: Secondary | ICD-10-CM | POA: Diagnosis not present

## 2023-09-26 DIAGNOSIS — M25562 Pain in left knee: Secondary | ICD-10-CM

## 2023-09-26 DIAGNOSIS — F17200 Nicotine dependence, unspecified, uncomplicated: Secondary | ICD-10-CM | POA: Diagnosis not present

## 2023-09-26 DIAGNOSIS — S8992XA Unspecified injury of left lower leg, initial encounter: Secondary | ICD-10-CM | POA: Insufficient documentation

## 2023-09-26 DIAGNOSIS — S80912A Unspecified superficial injury of left knee, initial encounter: Secondary | ICD-10-CM | POA: Diagnosis not present

## 2023-09-26 MED ORDER — IBUPROFEN 600 MG PO TABS
600.0000 mg | ORAL_TABLET | Freq: Once | ORAL | Status: AC
  Administered 2023-09-26: 600 mg via ORAL
  Filled 2023-09-26: qty 1

## 2023-09-26 MED ORDER — MELOXICAM 15 MG PO TABS: 15.0000 mg | ORAL_TABLET | Freq: Every day | ORAL | 0 refills | Status: AC

## 2023-09-26 MED ORDER — CYCLOBENZAPRINE HCL 10 MG PO TABS: 10.0000 mg | ORAL_TABLET | Freq: Three times a day (TID) | ORAL | 0 refills | Status: AC | PRN

## 2023-09-26 NOTE — ED Triage Notes (Signed)
 Upon triaging patient, pt taken off c-collar stating she has no pain. Refused wheelchair- ambulatory independently. Pt threw c-collar off. PT educated on importance. Pt stating I don't need it while moving neck around.
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
## 2023-11-02 DIAGNOSIS — Z1211 Encounter for screening for malignant neoplasm of colon: Secondary | ICD-10-CM | POA: Diagnosis not present

## 2023-11-02 DIAGNOSIS — Z0000 Encounter for general adult medical examination without abnormal findings: Secondary | ICD-10-CM | POA: Diagnosis not present

## 2023-11-02 DIAGNOSIS — Z1231 Encounter for screening mammogram for malignant neoplasm of breast: Secondary | ICD-10-CM | POA: Diagnosis not present

## 2023-11-02 DIAGNOSIS — Z1389 Encounter for screening for other disorder: Secondary | ICD-10-CM | POA: Diagnosis not present

## 2023-11-02 DIAGNOSIS — I480 Paroxysmal atrial fibrillation: Secondary | ICD-10-CM | POA: Diagnosis not present

## 2023-11-02 DIAGNOSIS — Z13220 Encounter for screening for lipoid disorders: Secondary | ICD-10-CM | POA: Diagnosis not present

## 2023-11-02 DIAGNOSIS — Z1331 Encounter for screening for depression: Secondary | ICD-10-CM | POA: Diagnosis not present

## 2023-11-02 DIAGNOSIS — Z131 Encounter for screening for diabetes mellitus: Secondary | ICD-10-CM | POA: Diagnosis not present

## 2023-11-02 DIAGNOSIS — Z1159 Encounter for screening for other viral diseases: Secondary | ICD-10-CM | POA: Diagnosis not present

## 2023-11-03 ENCOUNTER — Other Ambulatory Visit: Payer: Self-pay | Admitting: Internal Medicine

## 2023-11-03 DIAGNOSIS — Z1231 Encounter for screening mammogram for malignant neoplasm of breast: Secondary | ICD-10-CM
